# Patient Record
Sex: Female | Born: 1960 | Race: White | Hispanic: No | Marital: Married | Smoking: Current every day smoker
Health system: Southern US, Community
[De-identification: ages and names within clinical notes are randomized; demographics above are authoritative.]

## PROBLEM LIST (undated history)

## (undated) DIAGNOSIS — J189 Pneumonia, unspecified organism: Secondary | ICD-10-CM

## (undated) DIAGNOSIS — M1990 Unspecified osteoarthritis, unspecified site: Secondary | ICD-10-CM

## (undated) DIAGNOSIS — Z8709 Personal history of other diseases of the respiratory system: Secondary | ICD-10-CM

## (undated) DIAGNOSIS — K219 Gastro-esophageal reflux disease without esophagitis: Secondary | ICD-10-CM

## (undated) HISTORY — PX: TONSILLECTOMY: SUR1361

## (undated) HISTORY — PX: HYSTEROSCOPY DIAGNOSTIC: PRO49

## (undated) HISTORY — PX: EYE SURGERY: SHX253

## (undated) HISTORY — PX: NECK SURGERY: SHX720

## (undated) HISTORY — PX: BREAST SURGERY: SHX581

## (undated) HISTORY — PX: OTHER SURGICAL HISTORY: SHX169

---

## 1998-01-10 ENCOUNTER — Ambulatory Visit (HOSPITAL_COMMUNITY): Admission: RE | Admit: 1998-01-10 | Discharge: 1998-01-10 | Payer: Self-pay | Admitting: Oral and Maxillofacial Surgery

## 1998-08-10 ENCOUNTER — Encounter: Payer: Self-pay | Admitting: Urology

## 1998-08-10 ENCOUNTER — Ambulatory Visit (HOSPITAL_COMMUNITY): Admission: RE | Admit: 1998-08-10 | Discharge: 1998-08-10 | Payer: Self-pay | Admitting: Obstetrics and Gynecology

## 1999-01-01 ENCOUNTER — Emergency Department (HOSPITAL_COMMUNITY): Admission: EM | Admit: 1999-01-01 | Discharge: 1999-01-01 | Payer: Self-pay | Admitting: Emergency Medicine

## 1999-01-03 ENCOUNTER — Ambulatory Visit (HOSPITAL_COMMUNITY): Admission: RE | Admit: 1999-01-03 | Discharge: 1999-01-03 | Payer: Self-pay | Admitting: Internal Medicine

## 1999-07-30 ENCOUNTER — Other Ambulatory Visit: Admission: RE | Admit: 1999-07-30 | Discharge: 1999-07-30 | Payer: Self-pay | Admitting: Gynecology

## 1999-10-18 ENCOUNTER — Ambulatory Visit (HOSPITAL_COMMUNITY): Admission: RE | Admit: 1999-10-18 | Discharge: 1999-10-18 | Payer: Self-pay | Admitting: Gastroenterology

## 2000-07-30 ENCOUNTER — Other Ambulatory Visit: Admission: RE | Admit: 2000-07-30 | Discharge: 2000-07-30 | Payer: Self-pay | Admitting: Gynecology

## 2000-08-11 ENCOUNTER — Ambulatory Visit (HOSPITAL_COMMUNITY): Admission: RE | Admit: 2000-08-11 | Discharge: 2000-08-11 | Payer: Self-pay | Admitting: *Deleted

## 2000-08-11 ENCOUNTER — Encounter: Payer: Self-pay | Admitting: *Deleted

## 2001-02-09 ENCOUNTER — Encounter: Payer: Self-pay | Admitting: Family Medicine

## 2001-02-09 ENCOUNTER — Ambulatory Visit (HOSPITAL_COMMUNITY): Admission: RE | Admit: 2001-02-09 | Discharge: 2001-02-09 | Payer: Self-pay | Admitting: Family Medicine

## 2001-06-11 ENCOUNTER — Ambulatory Visit: Admission: RE | Admit: 2001-06-11 | Discharge: 2001-06-11 | Payer: Self-pay | Admitting: Family Medicine

## 2001-06-11 ENCOUNTER — Encounter: Payer: Self-pay | Admitting: Family Medicine

## 2002-02-04 ENCOUNTER — Other Ambulatory Visit: Admission: RE | Admit: 2002-02-04 | Discharge: 2002-02-04 | Payer: Self-pay | Admitting: Gynecology

## 2002-02-18 ENCOUNTER — Inpatient Hospital Stay (HOSPITAL_COMMUNITY): Admission: AD | Admit: 2002-02-18 | Discharge: 2002-02-18 | Payer: Self-pay | Admitting: Gynecology

## 2002-06-23 ENCOUNTER — Encounter: Admission: RE | Admit: 2002-06-23 | Discharge: 2002-06-23 | Payer: Self-pay | Admitting: Family Medicine

## 2002-07-01 ENCOUNTER — Encounter: Payer: Self-pay | Admitting: Family Medicine

## 2002-07-01 ENCOUNTER — Ambulatory Visit (HOSPITAL_COMMUNITY): Admission: RE | Admit: 2002-07-01 | Discharge: 2002-07-01 | Payer: Self-pay | Admitting: Family Medicine

## 2003-02-09 ENCOUNTER — Other Ambulatory Visit: Admission: RE | Admit: 2003-02-09 | Discharge: 2003-02-09 | Payer: Self-pay | Admitting: Gynecology

## 2004-03-08 ENCOUNTER — Other Ambulatory Visit: Admission: RE | Admit: 2004-03-08 | Discharge: 2004-03-08 | Payer: Self-pay | Admitting: Gynecology

## 2004-03-09 ENCOUNTER — Encounter: Admission: RE | Admit: 2004-03-09 | Discharge: 2004-03-09 | Payer: Self-pay | Admitting: Specialist

## 2004-04-12 ENCOUNTER — Encounter: Admission: RE | Admit: 2004-04-12 | Discharge: 2004-04-12 | Payer: Self-pay | Admitting: Gynecology

## 2005-03-08 ENCOUNTER — Other Ambulatory Visit: Admission: RE | Admit: 2005-03-08 | Discharge: 2005-03-08 | Payer: Self-pay | Admitting: Gynecology

## 2005-03-15 ENCOUNTER — Encounter: Admission: RE | Admit: 2005-03-15 | Discharge: 2005-03-15 | Payer: Self-pay | Admitting: Gynecology

## 2005-06-05 ENCOUNTER — Inpatient Hospital Stay (HOSPITAL_COMMUNITY): Admission: RE | Admit: 2005-06-05 | Discharge: 2005-06-06 | Payer: Self-pay | Admitting: Neurosurgery

## 2006-03-12 ENCOUNTER — Other Ambulatory Visit: Admission: RE | Admit: 2006-03-12 | Discharge: 2006-03-12 | Payer: Self-pay | Admitting: Gynecology

## 2006-03-21 ENCOUNTER — Encounter: Admission: RE | Admit: 2006-03-21 | Discharge: 2006-03-21 | Payer: Self-pay | Admitting: Gynecology

## 2006-04-01 ENCOUNTER — Encounter: Admission: RE | Admit: 2006-04-01 | Discharge: 2006-04-01 | Payer: Self-pay | Admitting: Gynecology

## 2006-04-01 ENCOUNTER — Encounter (INDEPENDENT_AMBULATORY_CARE_PROVIDER_SITE_OTHER): Payer: Self-pay | Admitting: Specialist

## 2007-03-24 ENCOUNTER — Other Ambulatory Visit: Admission: RE | Admit: 2007-03-24 | Discharge: 2007-03-24 | Payer: Self-pay | Admitting: Gynecology

## 2007-03-24 ENCOUNTER — Encounter: Admission: RE | Admit: 2007-03-24 | Discharge: 2007-03-24 | Payer: Self-pay | Admitting: Gynecology

## 2008-03-23 ENCOUNTER — Encounter: Admission: RE | Admit: 2008-03-23 | Discharge: 2008-03-23 | Payer: Self-pay | Admitting: Gynecology

## 2008-03-23 ENCOUNTER — Other Ambulatory Visit: Admission: RE | Admit: 2008-03-23 | Discharge: 2008-03-23 | Payer: Self-pay | Admitting: Gynecology

## 2008-07-01 ENCOUNTER — Encounter: Admission: RE | Admit: 2008-07-01 | Discharge: 2008-07-01 | Payer: Self-pay | Admitting: Family Medicine

## 2008-10-12 ENCOUNTER — Ambulatory Visit (HOSPITAL_COMMUNITY): Admission: RE | Admit: 2008-10-12 | Discharge: 2008-10-12 | Payer: Self-pay | Admitting: Neurosurgery

## 2009-11-21 IMAGING — RF DG MYELOGRAM LUMBAR
13 series · 13 of 13 positions shown · IV contrast (omnipaque)
Comparison: MRI 07/01/2008
COMPARISON: MRI 07/01/2008.

CLINICAL DATA: Low back pain with right leg pain.

MYELOGRAM LUMBAR
TECHNIQUE: Lumbar puncture was performed at L2-3 by Dr. Barth.
Following injection of intrathecal Omnipaque contrast, spine
imaging in multiple projections was performed using fluoroscopy.
Fluoroscopy Time: 1.2 minutes.
CT MYELOGRAPHY LUMBAR SPINE
TECHNIQUE: CT imaging of the lumbar spine was performed after
intrathecal contrast administration.  Multiplanar CT image
reconstructions were also generated.

[Series 1: run · 1 of 1 slices shown (1 of 13)]
[im 1/1]
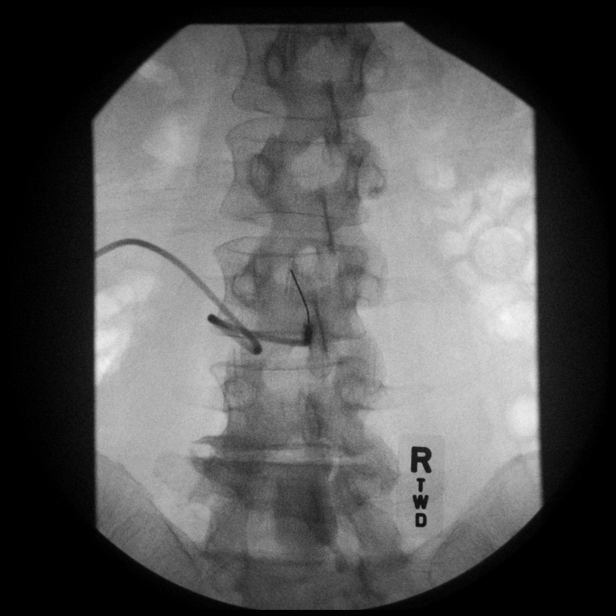

[Series 2: run · 1 of 1 slices shown (2 of 13)]
[im 1/1]
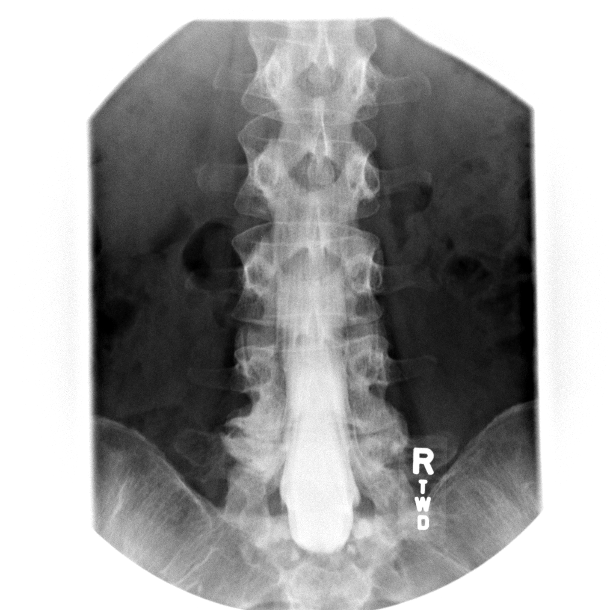

[Series 3: run · 1 of 1 slices shown (3 of 13)]
[im 1/1]
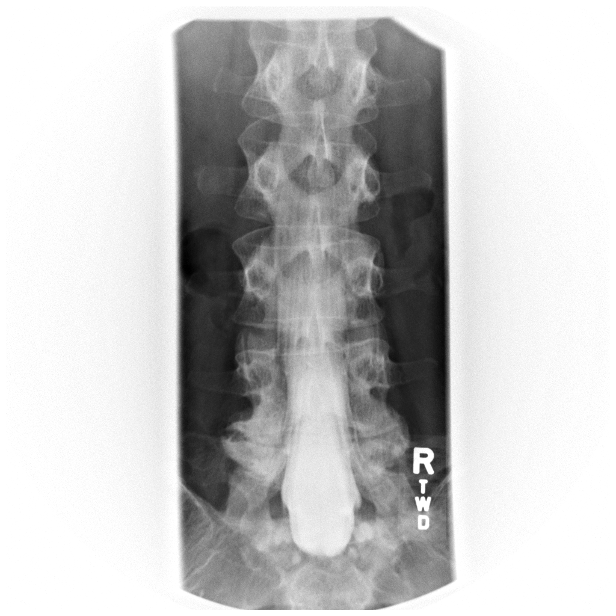

[Series 4: run · 1 of 1 slices shown (4 of 13)]
[im 1/1]
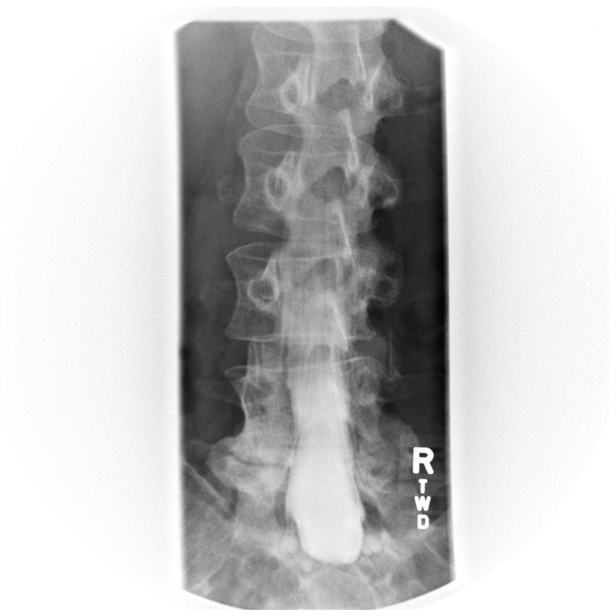

[Series 5: run · 1 of 1 slices shown (5 of 13)]
[im 1/1]
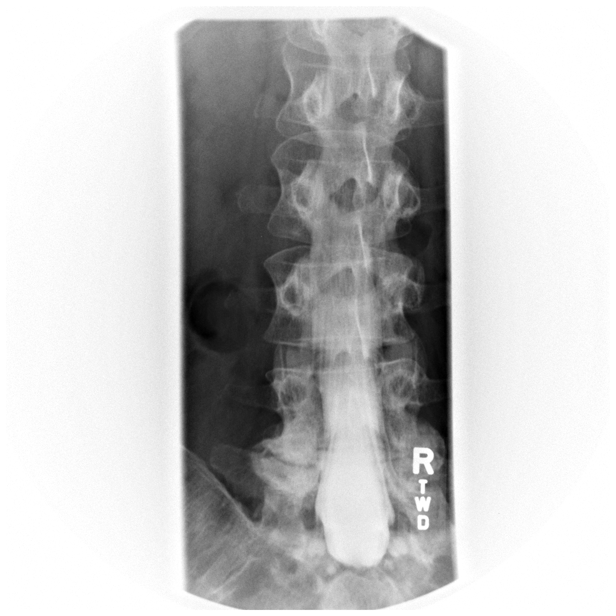

[Series 6: run · 1 of 1 slices shown (6 of 13)]
[im 1/1]
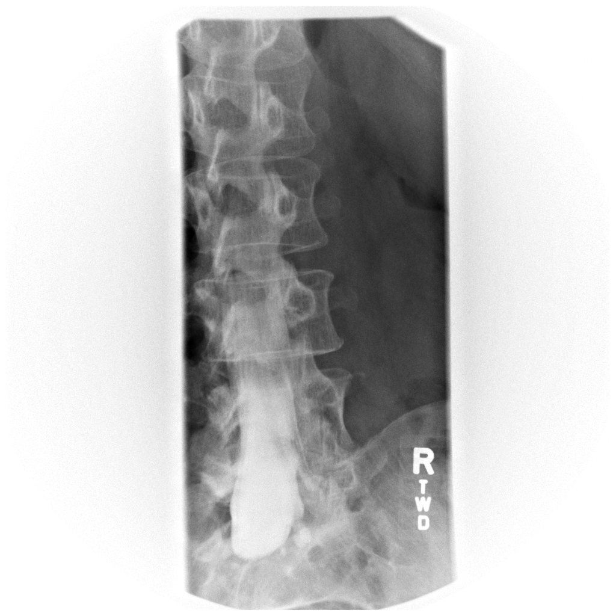

[Series 7: run · 1 of 1 slices shown (7 of 13)]
[im 1/1]
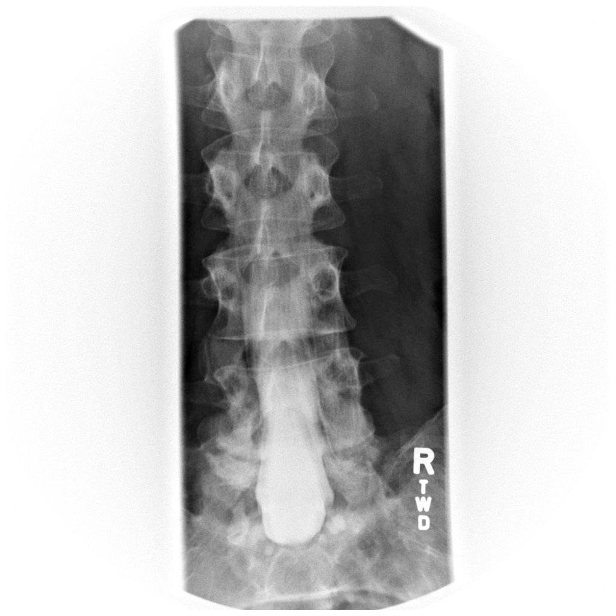

[Series 8: run · 1 of 1 slices shown (8 of 13)]
[im 1/1]
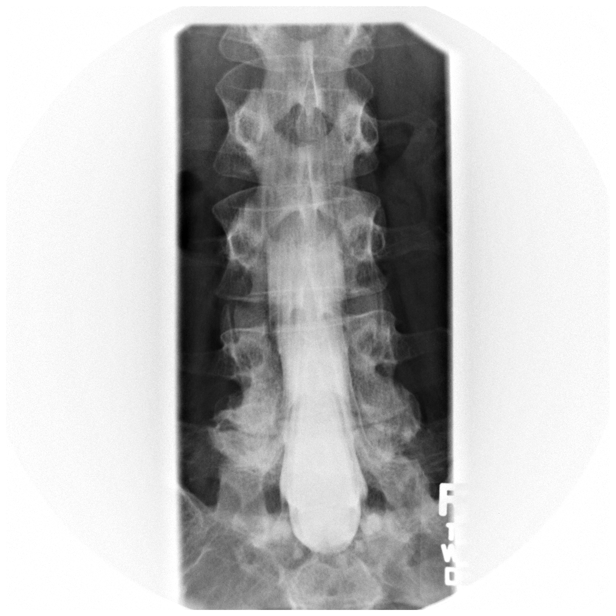

[Series 9: run · 1 of 1 slices shown (9 of 13)]
[im 1/1]
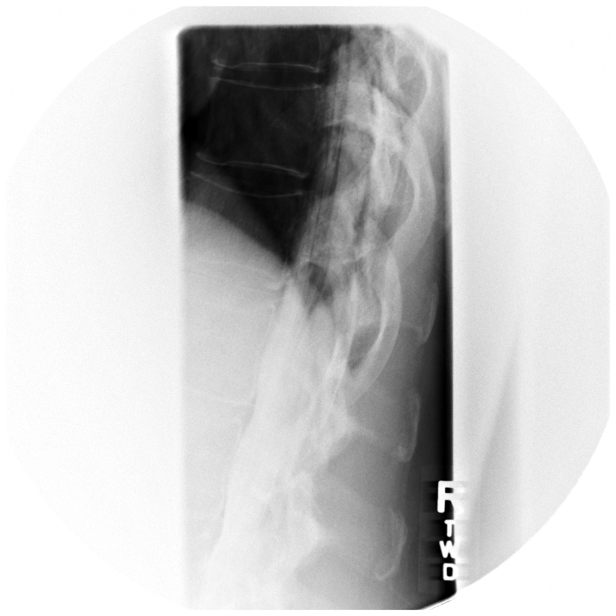

[Series 10: run · 1 of 1 slices shown (10 of 13)]
[im 1/1]
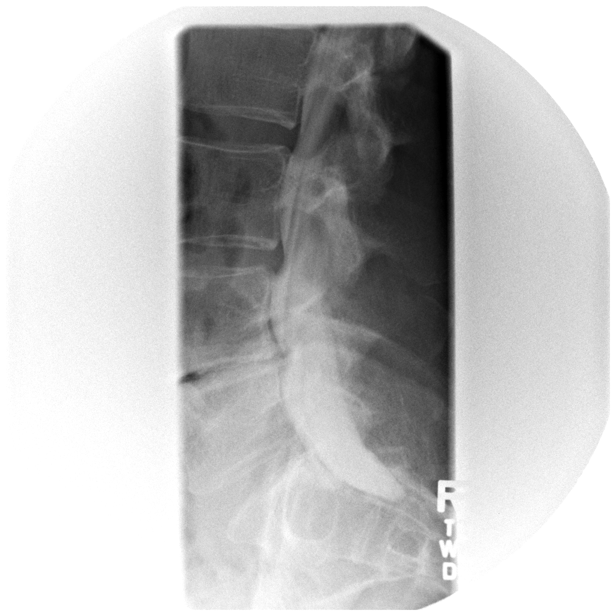

[Series 11: run · 1 of 1 slices shown (11 of 13)]
[im 1/1]
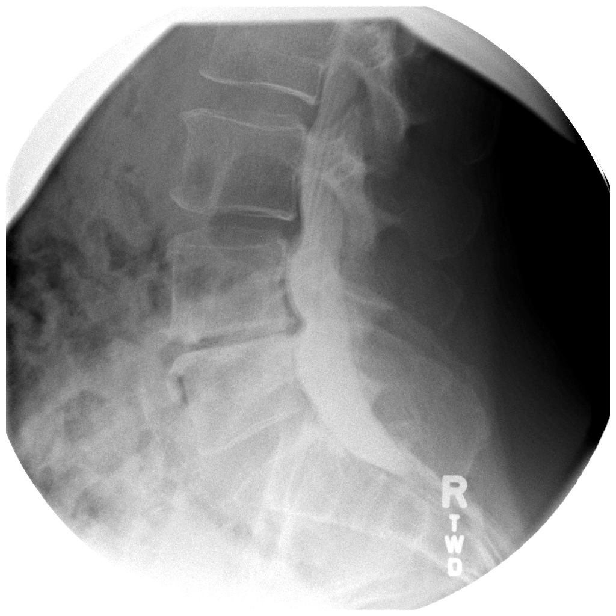

[Series 12: run · 1 of 1 slices shown (12 of 13)]
[im 1/1]
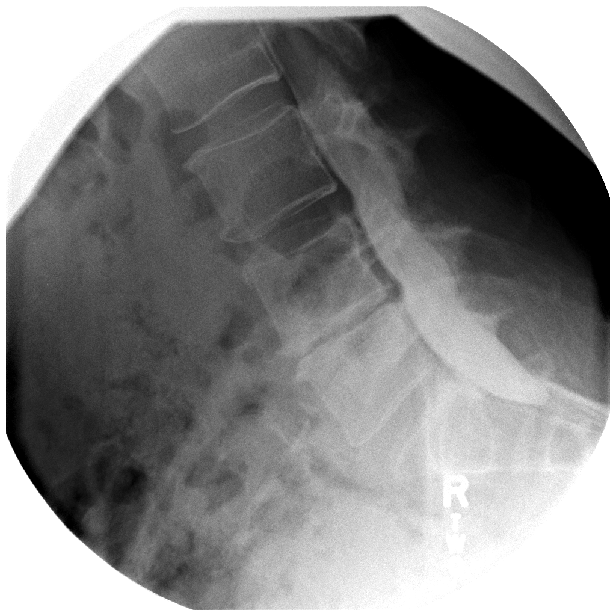

[Series 13: run · 1 of 1 slices shown (13 of 13)]
[im 1/1]
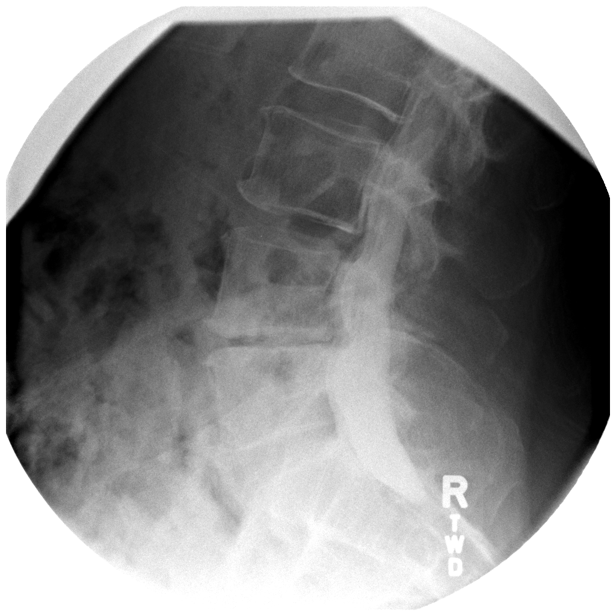

[13 of 13 positions shown; findings below may reference images not displayed]

FINDINGS: Normal lumbar alignment.  No fracture is identified.
There is advanced disc degeneration at L4-5 with disc space
narrowing and endplate spurring causing a ventral epidural defect.
There is a mild ventral extradural defect at L3-4.  No significant
spinal stenosis.

Standing flexion/extension views reveal stable alignment without
abnormal motion.  Ventral extra defect at L3-4 is  more prominent
on extension than  flexion.  Conus medullaris is normal.
IMPRESSION: Advanced disc degeneration and spurring at L4-5.  Mild ventral
extradural defect at L3-4.

No significant spinal stenosis.
FINDINGS: Normal lumbar alignment.  No acute bony abnormality.
The conus medullaris is normal.

L1-2:  Negative

L2-3:  Negative

L3-4:  Shallow central to left-sided small disc protrusion is
unchanged from the MRI.  Questionable encroachment of the left L4
nerve root in the lateral recess.  The left L3 nerve root is
slightly swollen but not compressed in the foramen.  There is
contrast in the left L3 nerve root sleeve and the nerve root
appears slightly swollen.  There is early facet degeneration
without significant spinal stenosis.

L4-5:  Advanced disc degeneration with disc space narrowing and
vertebral spurring, primarily on the left side.  This is causing
mild left foraminal encroachment.  There is moderate facet
overgrowth bilaterally.  Central canal is patent.

L5-S1:  Mild disc degeneration without stenosis or disc protrusion.
IMPRESSION: Shallow central and left-sided disc protrusion at L3-4 is unchanged
from the prior MRI.  There is questionable impingement of the left
L4 nerve root in the lateral recess.  The left L3 nerve root
appears slightly swollen but is not compressed.

Advanced disc degeneration at L4-5 with left-sided spurring and
mild left foraminal encroachment.

No change from the prior MRI.

## 2010-01-30 ENCOUNTER — Encounter: Admission: RE | Admit: 2010-01-30 | Discharge: 2010-01-30 | Payer: Self-pay | Admitting: Gynecology

## 2010-09-21 NOTE — Procedures (Signed)
[facility]  Patient:    Rose Blair, Rose Blair                       MRN: [id number] Adm. Date:  [id number] Disc. Date: [id number] Attending:  Deneen Harts CC:         Leatha Gilding. Mezer, M.D.                           Procedure Report  PROCEDURE:  Colonoscopy.  INDICATION:  A 50 year old white female with intermittent hematochezia.  Left lower quadrant pain.  Known history of endometriosis.  Undergoing colonoscopy for neoplasia surveillance and for diagnostic evaluation.  DESCRIPTION OF PROCEDURE:  After reviewing the nature of the procedure with the patient, including potential risks and complications, and after discussing alternative methods of diagnosis and treatment, informed consent was signed.  The patient was premedicated, receiving IV sedating totalling Versed 10 mg, fentanyl 75 mcg administered in divided doses prior to and during the course of the procedure.  Using an Olympus pediatric PCF-140L video colonoscope, rectum was intubated after normal digital examination.  The scope was advanced around the entire length of the colon to the cecum, identified by the appendiceal orifice and ileocecal valve.  Visicol preparation was excellent throughout.  The scope was slowly withdrawn with careful inspection of the entire colon in a retrograde manner.  No abnormality was noted.  Specifically without evidence of neoplasia, mucosal inflammation, vascular lesion, or diverticular change.  No lesion in the rectosigmoid to explain left lower quadrant pain.  Retroflexed view in the rectal vault was normal.  Colon was decompressed, scope withdrawn.  The patient tolerated the procedure without difficulty, being maintained on Datascope monitor, low-flow oxygen throughout.  Returned to recovery in stable condition.  Time 2, technical 1, preparation 1 (Visicol), total score equals 4.  ASSESSMENT: 1. Normal colonoscopy. 2. Hematochezia, probably secondary to  intermittent anorectal fissure.  RECOMMENDATION: 1. Repeat colonoscopy 10 years for neoplasia surveillance. 2. Annual Hemoccult per Dr. Chevis Pretty. 3. Miralax one cap full nightly. 4. Levbid one tablet p.o. q.h.s. DD:  10/18/99 TD:  10/23/99 Job: [id number]

## 2010-09-21 NOTE — Op Note (Signed)
NAMEJAVEAH, Rose Blair                ACCOUNT NO.:  [id number]   MEDICAL RECORD NO.:  [id number]          PATIENT TYPE:  INP   LOCATION:  2899                         FACILITY:  [facility]   PHYSICIAN:  Hewitt Shorts, M.D.DATE OF BIRTH:  10-20-1960   DATE OF PROCEDURE:  06/05/2005  DATE OF DISCHARGE:                                 OPERATIVE REPORT   PREOPERATIVE DIAGNOSES:  C5-6 and C6-7 cervical spondylosis and degenerative  disk disease, C6-7 cervical disk herniation and right cervical  radiculopathy.   POSTOPERATIVE DIAGNOSES:  C5-6 and C6-7 cervical spondylosis and  degenerative disk disease, C6-7 cervical disk herniation and right cervical  radiculopathy.   PROCEDURE:  C5-6 and C6-7 anterior cervical diskectomy and arthrodesis with  VG-2 allograft and Tether cervical plating.   SURGEON:  Hewitt Shorts, M.D.   ASSISTANT:  Stefani Dama, M.D.   ANESTHESIA:  General endotracheal.   INDICATIONS:  This is a 50 year old woman who presented with a right  cervical radiculopathy.  She had relatively advanced spondylosis and  degenerative disk disease involving the C5-6 level with more mild  degenerative changes seen at C6-7; however, there was a central and to the  right C6-7 cervical disk herniation.  A decision made to proceed with a two-  level anterior cervical diskectomy and arthrodesis.   PROCEDURE:  The patient was brought to the operating room and placed under  general endotracheal anesthesia.  The patient placed in 10 pounds of halter  traction and the neck was prepped with Betadine soap and solution and draped  in a sterile fashion.  A horizontal incision was made in the left side of  the neck.  The line of the incision was infiltrated with local anesthetic  with epinephrine.  Dissection was carried down through the subcutaneous  tissue and platysma with bipolar cautery and electrocautery used to maintain  hemostasis.  Dissection was carried down through an  avascular plane leaving  the sternocleidomastoid, carotid artery and jugular vein laterally and the  trachea and esophagus medially.  The ventral aspect of the vertebral column  identified, a localizing x-ray was taken, and the C5-6 and C6-7  intervertebral disk spaces were identified.  Diskectomy was begun with the  incision in the annulus, continued with microcurettes and pituitary  rongeurs.  Anterior aspect of the overgrowth was carefully removed.  The  microscope was draped and brought into the field to provide additional  magnification, illumination and visualization, and the remainder of the  decompression was performed using microdissection and microsurgical  technique.  The cartilaginous end plates of the corresponding vertebrae were  removed using microcurettes along with the X-Max drill and then the  posterior osteophytic overgrowth was removed using the X-Max drill along  with the 2 mm Kerrison punch with a thin foot plate.  The posterior  longitudinal ligament was carefully opened at each level.  The disk  herniation at C6-7 was then removed and the posterior longitudinal ligament  was removed, decompressing the spinal canal and thecal sac.  We then turned  our attention to the neural foramina bilaterally at each  level, which were  decompressed using the X-Max drill and Kerrison punches.  Once each of the  neural foramina and exiting nerve roots was decompressed, we established  hemostasis with the use of Gelfoam soaked in thrombin and then proceeded  with an arthrodesis.  We measured the height of the intervertebral disk  space at each level and selected a 7 mm graft for each level.  These were  hydrated withy saline solution and then carefully positioned in the  intervertebral disk space and counter sunk.  We then selected a 35 mm Tether  cervical plate.  It was positioned over the fusion construct and secured  with 4 x 14 mm screws.  We placed two screws at C5 and C7 and a  single screw  at C6.  Each of the screw heads was drilled and tapped and the screws placed  in an alternating fashion.  Final tightening was done once all five screws  were in place.  The wound was then irrigated with bacitracin solution and  checked for hemostasis, which was established and confirmed.  An x-ray was  taken, which showed the screws at C5 and C6 to be in good position as well  as the graft at C5-6.  The remainder of the construct could not be  visualized because of the patient's shoulders.  Under direct visualization,  the construct appeared good.  We then checked once again for hemostasis,  which was established and confirmed, and then proceeded with closure.  The  platysma was closed with interrupted, inverted 2-0 undyed Vicryl sutures,  the subcutaneous and subcuticular layer were closed with interrupted,  inverted 3-0 undyed Vicryl sutures, and the skin was approximated with  Dermabond.  The procedure was tolerated well.  The estimated blood loss was  50 mL, sponge and needle count correct.  Following the surgery, the patient  was to be reversed from the anesthetic, extubated and transferred to the  recovery room for further care.      Hewitt Shorts, M.D.  Electronically Signed     RWN/MEDQ  D:  06/05/2005  T:  06/05/2005  Job:  [id number]

## 2011-05-23 ENCOUNTER — Other Ambulatory Visit: Payer: Self-pay | Admitting: Endodontics

## 2014-08-15 ENCOUNTER — Other Ambulatory Visit: Payer: Self-pay | Admitting: Gynecology

## 2014-08-16 LAB — CYTOLOGY - PAP

## 2015-11-18 ENCOUNTER — Other Ambulatory Visit: Payer: Self-pay | Admitting: Physician Assistant

## 2015-11-18 DIAGNOSIS — M609 Myositis, unspecified: Secondary | ICD-10-CM

## 2015-12-20 ENCOUNTER — Ambulatory Visit
Admission: RE | Admit: 2015-12-20 | Discharge: 2015-12-20 | Disposition: A | Discharge status: Home / Self Care | Payer: Commercial Managed Care - PPO | Source: Ambulatory Visit | Attending: Physician Assistant | Admitting: Physician Assistant

## 2015-12-20 DIAGNOSIS — M609 Myositis, unspecified: Secondary | ICD-10-CM

## 2016-01-12 ENCOUNTER — Other Ambulatory Visit: Payer: Self-pay | Admitting: General Surgery

## 2016-02-01 ENCOUNTER — Encounter (HOSPITAL_COMMUNITY): Payer: Self-pay | Admitting: *Deleted

## 2016-02-01 ENCOUNTER — Encounter (HOSPITAL_COMMUNITY)
Admission: RE | Admit: 2016-02-01 | Discharge: 2016-02-01 | Disposition: A | Discharge status: Home / Self Care | Payer: Commercial Managed Care - PPO | Source: Ambulatory Visit | Attending: General Surgery | Admitting: General Surgery

## 2016-02-01 DIAGNOSIS — Z01812 Encounter for preprocedural laboratory examination: Secondary | ICD-10-CM | POA: Insufficient documentation

## 2016-02-01 DIAGNOSIS — M6281 Muscle weakness (generalized): Secondary | ICD-10-CM | POA: Insufficient documentation

## 2016-02-01 HISTORY — DX: Unspecified osteoarthritis, unspecified site: M19.90

## 2016-02-01 HISTORY — DX: Pneumonia, unspecified organism: J18.9

## 2016-02-01 HISTORY — DX: Personal history of other diseases of the respiratory system: Z87.09

## 2016-02-01 HISTORY — DX: Gastro-esophageal reflux disease without esophagitis: K21.9

## 2016-02-01 LAB — BASIC METABOLIC PANEL
ANION GAP: 6 (ref 5–15)
BUN: 14 mg/dL (ref 6–20)
CO2: 28 mmol/L (ref 22–32)
Calcium: 8.9 mg/dL (ref 8.9–10.3)
Chloride: 107 mmol/L (ref 101–111)
Creatinine, Ser: 0.92 mg/dL (ref 0.44–1.00)
GLUCOSE: 141 mg/dL — AB (ref 65–99)
POTASSIUM: 3.7 mmol/L (ref 3.5–5.1)
Sodium: 141 mmol/L (ref 135–145)

## 2016-02-01 LAB — CBC
HEMATOCRIT: 41.1 % (ref 36.0–46.0)
HEMOGLOBIN: 13.8 g/dL (ref 12.0–15.0)
MCH: 33.3 pg (ref 26.0–34.0)
MCHC: 33.6 g/dL (ref 30.0–36.0)
MCV: 99.3 fL (ref 78.0–100.0)
Platelets: 270 10*3/uL (ref 150–400)
RBC: 4.14 MIL/uL (ref 3.87–5.11)
RDW: 12.5 % (ref 11.5–15.5)
WBC: 7.4 10*3/uL (ref 4.0–10.5)

## 2016-02-01 NOTE — Patient Instructions (Signed)
Rose Blair  02/01/2016   Your procedure is scheduled on: Thursday February 08, 2016  [facility]  Entrance take [name]  elevators to 3rd floor to  Short Stay Center at 5:30 AM.  Call this number if you have problems the morning of surgery [phone]   Remember: ONLY 1 PERSON MAY GO WITH YOU TO SHORT STAY TO GET  READY MORNING OF YOUR SURGERY.  Do not eat food or drink liquids :After Midnight.     Take these medicines the morning of surgery: May use eye drops if needed May use flonase nasal spray if needed                                 You may not have any metal on your body including hair pins and              piercings  Do not wear jewelry, make-up, lotions, powders or perfumes, deodorant             Do not wear nail polish.  Do not shave  48 hours prior to surgery.        Do not bring valuables to the hospital. [facility] IS NOT             RESPONSIBLE   FOR VALUABLES.  Contacts, dentures or bridgework may not be worn into surgery. .     Patients discharged the day of surgery will not be allowed to drive home.  Name and phone number of your driver:Rose Blair (husband)  _____________________________________________________________________             [facility] Pre[facility] Before surgery, you can play an important role.  Because skin is not sterile, your skin needs to be as free of germs as possible.  You can reduce the number of germs on your skin by washing with CHG (chlorahexidine gluconate) soap before surgery.  CHG is an antiseptic cleaner which kills germs and bonds with the skin to continue killing germs even after washing. Please DO NOT use if you have an allergy to CHG or antibacterial soaps.  If your skin becomes reddened/irritated stop using the CHG and inform your nurse when you arrive at Short Stay. Do not shave (including legs and underarms) for at least 48 hours prior to the first CHG shower.  You may shave  your face/neck. Please follow these instructions carefully:  1.  Shower with CHG Soap the night before surgery and the  morning of Surgery.  2.  If you choose to wash your hair, wash your hair first as usual with your  normal  shampoo.  3.  After you shampoo, rinse your hair and body thoroughly to remove the  shampoo.                           4.  Use CHG as you would any other liquid soap.  You can apply chg directly  to the skin and wash                       Gently with a scrungie or clean washcloth.  5.  Apply the CHG Soap to your body ONLY FROM THE NECK DOWN.   Do not use on face/ open  Wound or open sores. Avoid contact with eyes, ears mouth and genitals (private parts).                       Wash face,  Genitals (private parts) with your normal soap.             6.  Wash thoroughly, paying special attention to the area where your surgery  will be performed.  7.  Thoroughly rinse your body with warm water from the neck down.  8.  DO NOT shower/wash with your normal soap after using and rinsing off  the CHG Soap.                9.  Pat yourself dry with a clean towel.            10.  Wear clean pajamas.            11.  Place clean sheets on your bed the night of your first shower and do not  sleep with pets. Day of Surgery : Do not apply any lotions/deodorants the morning of surgery.  Please wear clean clothes to the hospital/surgery center.  FAILURE TO FOLLOW THESE INSTRUCTIONS MAY RESULT IN THE CANCELLATION OF YOUR SURGERY PATIENT SIGNATURE_________________________________  NURSE SIGNATURE__________________________________  ________________________________________________________________________

## 2016-02-07 NOTE — Anesthesia Preprocedure Evaluation (Addendum)
Anesthesia Evaluation  Patient identified by MRN, date of birth, ID band Patient awake    Reviewed: Allergy & Precautions, NPO status , Patient's Chart, lab work & pertinent test results  History of Anesthesia Complications Negative for: history of anesthetic complications  Airway Mallampati: II   Neck ROM: full    Dental   Pulmonary Current Smoker,    breath sounds clear to auscultation       Cardiovascular negative cardio ROS   Rhythm:regular Rate:Normal     Neuro/Psych negative neurological ROS  negative psych ROS   GI/Hepatic Neg liver ROS, GERD  ,  Endo/Other  negative endocrine ROS  Renal/GU negative Renal ROS     Musculoskeletal  (+) Arthritis ,   Abdominal   Peds  Hematology   Anesthesia Other Findings   Reproductive/Obstetrics                            Anesthesia Physical Anesthesia Plan  ASA: II  Anesthesia Plan: General   Post-op Pain Management:    Induction: Intravenous  Airway Management Planned: LMA  Additional Equipment:   Intra-op Plan:   Post-operative Plan: Extubation in OR  Informed Consent: I have reviewed the patients History and Physical, chart, labs and discussed the procedure including the risks, benefits and alternatives for the proposed anesthesia with the patient or authorized representative who has indicated his/her understanding and acceptance.     Plan Discussed with: CRNA, Anesthesiologist and Surgeon  Anesthesia Plan Comments:        Anesthesia Quick Evaluation

## 2016-02-08 ENCOUNTER — Encounter (HOSPITAL_COMMUNITY): Payer: Self-pay | Admitting: *Deleted

## 2016-02-08 ENCOUNTER — Ambulatory Visit (HOSPITAL_COMMUNITY): Payer: Commercial Managed Care - PPO | Admitting: Anesthesiology

## 2016-02-08 ENCOUNTER — Encounter (HOSPITAL_COMMUNITY)
Admission: RE | Disposition: A | Discharge status: Home / Self Care | Payer: Self-pay | Source: Ambulatory Visit | Attending: General Surgery

## 2016-02-08 ENCOUNTER — Ambulatory Visit (HOSPITAL_COMMUNITY)
Admission: RE | Admit: 2016-02-08 | Discharge: 2016-02-08 | Disposition: A | Discharge status: Home / Self Care | Payer: Commercial Managed Care - PPO | Source: Ambulatory Visit | Attending: General Surgery | Admitting: General Surgery

## 2016-02-08 DIAGNOSIS — M62551 Muscle wasting and atrophy, not elsewhere classified, right thigh: Secondary | ICD-10-CM | POA: Insufficient documentation

## 2016-02-08 DIAGNOSIS — M6281 Muscle weakness (generalized): Secondary | ICD-10-CM | POA: Insufficient documentation

## 2016-02-08 DIAGNOSIS — F17200 Nicotine dependence, unspecified, uncomplicated: Secondary | ICD-10-CM | POA: Insufficient documentation

## 2016-02-08 DIAGNOSIS — M1990 Unspecified osteoarthritis, unspecified site: Secondary | ICD-10-CM | POA: Diagnosis not present

## 2016-02-08 DIAGNOSIS — Z791 Long term (current) use of non-steroidal anti-inflammatories (NSAID): Secondary | ICD-10-CM | POA: Insufficient documentation

## 2016-02-08 HISTORY — PX: MUSCLE BIOPSY: SHX716

## 2016-02-08 SURGERY — MUSCLE BIOPSY
Anesthesia: General | Site: Thigh | Laterality: Right

## 2016-02-08 MED ORDER — PROPOFOL 10 MG/ML IV BOLUS
INTRAVENOUS | Status: DC | PRN
  Administered 2016-02-08: 200 mg via INTRAVENOUS

## 2016-02-08 MED ORDER — FENTANYL CITRATE (PF) 100 MCG/2ML IJ SOLN
INTRAMUSCULAR | Status: DC | PRN
  Administered 2016-02-08: 50 ug via INTRAVENOUS

## 2016-02-08 MED ORDER — LIDOCAINE 2% (20 MG/ML) 5 ML SYRINGE
INTRAMUSCULAR | Status: AC
  Filled 2016-02-08: qty 5

## 2016-02-08 MED ORDER — FENTANYL CITRATE (PF) 100 MCG/2ML IJ SOLN
INTRAMUSCULAR | Status: AC
  Filled 2016-02-08: qty 2

## 2016-02-08 MED ORDER — MIDAZOLAM HCL 2 MG/2ML IJ SOLN
INTRAMUSCULAR | Status: AC
  Filled 2016-02-08: qty 2

## 2016-02-08 MED ORDER — CEFAZOLIN SODIUM-DEXTROSE 2-4 GM/100ML-% IV SOLN
INTRAVENOUS | Status: AC
  Filled 2016-02-08: qty 100

## 2016-02-08 MED ORDER — MIDAZOLAM HCL 5 MG/5ML IJ SOLN
INTRAMUSCULAR | Status: DC | PRN
  Administered 2016-02-08: 2 mg via INTRAVENOUS

## 2016-02-08 MED ORDER — CEFAZOLIN SODIUM-DEXTROSE 2-4 GM/100ML-% IV SOLN
2.0000 g | INTRAVENOUS | Status: AC
  Administered 2016-02-08: 2 g via INTRAVENOUS

## 2016-02-08 MED ORDER — LIDOCAINE HCL (CARDIAC) 20 MG/ML IV SOLN
INTRAVENOUS | Status: DC | PRN
  Administered 2016-02-08: 100 mg via INTRAVENOUS

## 2016-02-08 MED ORDER — ONDANSETRON HCL 4 MG/2ML IJ SOLN
INTRAMUSCULAR | Status: AC
  Filled 2016-02-08: qty 2

## 2016-02-08 MED ORDER — 0.9 % SODIUM CHLORIDE (POUR BTL) OPTIME
TOPICAL | Status: DC | PRN
  Administered 2016-02-08: 1000 mL

## 2016-02-08 MED ORDER — LACTATED RINGERS IV SOLN
INTRAVENOUS | Status: DC | PRN
  Administered 2016-02-08: 1000 mL
  Administered 2016-02-08: 07:00:00 via INTRAVENOUS

## 2016-02-08 MED ORDER — BUPIVACAINE-EPINEPHRINE (PF) 0.5% -1:200000 IJ SOLN
INTRAMUSCULAR | Status: AC
  Filled 2016-02-08: qty 1.8

## 2016-02-08 MED ORDER — ONDANSETRON HCL 4 MG/2ML IJ SOLN
INTRAMUSCULAR | Status: DC | PRN
  Administered 2016-02-08: 4 mg via INTRAVENOUS

## 2016-02-08 MED ORDER — BUPIVACAINE-EPINEPHRINE 0.25% -1:200000 IJ SOLN
INTRAMUSCULAR | Status: DC | PRN
  Administered 2016-02-08: 15 mL

## 2016-02-08 MED ORDER — BUPIVACAINE HCL (PF) 0.5 % IJ SOLN
INTRAMUSCULAR | Status: AC
  Filled 2016-02-08: qty 30

## 2016-02-08 MED ORDER — PROPOFOL 10 MG/ML IV BOLUS
INTRAVENOUS | Status: AC
  Filled 2016-02-08: qty 40

## 2016-02-08 MED ORDER — DEXAMETHASONE SODIUM PHOSPHATE 10 MG/ML IJ SOLN
INTRAMUSCULAR | Status: DC | PRN
  Administered 2016-02-08: 10 mg via INTRAVENOUS

## 2016-02-08 MED ORDER — PROMETHAZINE HCL 25 MG/ML IJ SOLN: 6.2500 mg | INTRAMUSCULAR | Status: DC | PRN

## 2016-02-08 MED ORDER — DEXAMETHASONE SODIUM PHOSPHATE 10 MG/ML IJ SOLN
INTRAMUSCULAR | Status: AC
  Filled 2016-02-08: qty 1

## 2016-02-08 MED ORDER — HYDROMORPHONE HCL 1 MG/ML IJ SOLN: 0.2500 mg | INTRAMUSCULAR | Status: DC | PRN

## 2016-02-08 SURGICAL SUPPLY — 26 items
ADH SKN CLS APL DERMABOND .7 (GAUZE/BANDAGES/DRESSINGS) ×1
BLADE SURG SZ10 CARB STEEL (BLADE) ×2 IMPLANT
COVER SURGICAL LIGHT HANDLE (MISCELLANEOUS) ×2 IMPLANT
DECANTER SPIKE VIAL GLASS SM (MISCELLANEOUS) IMPLANT
DERMABOND ADVANCED (GAUZE/BANDAGES/DRESSINGS) ×1
DERMABOND ADVANCED .7 DNX12 (GAUZE/BANDAGES/DRESSINGS) IMPLANT
DRAPE LAPAROTOMY TRNSV 102X78 (DRAPE) IMPLANT
ELECT PENCIL ROCKER SW 15FT (MISCELLANEOUS) ×2 IMPLANT
ELECT REM PT RETURN 9FT ADLT (ELECTROSURGICAL) ×2
ELECTRODE REM PT RTRN 9FT ADLT (ELECTROSURGICAL) ×1 IMPLANT
GAUZE SPONGE 4X4 12PLY STRL (GAUZE/BANDAGES/DRESSINGS) ×2 IMPLANT
GLOVE BIOGEL PI IND STRL 7.0 (GLOVE) ×1 IMPLANT
GLOVE BIOGEL PI INDICATOR 7.0 (GLOVE) ×1
GOWN STRL REUS W/TWL 2XL LVL3 (GOWN DISPOSABLE) ×2 IMPLANT
GOWN STRL REUS W/TWL XL LVL3 (GOWN DISPOSABLE) ×2 IMPLANT
KIT BASIN OR (CUSTOM PROCEDURE TRAY) ×2 IMPLANT
NEEDLE HYPO 22GX1.5 SAFETY (NEEDLE) ×2 IMPLANT
PACK BASIC VI WITH GOWN DISP (CUSTOM PROCEDURE TRAY) ×2 IMPLANT
SPONGE LAP 18X18 X RAY DECT (DISPOSABLE) ×2 IMPLANT
STAPLER VISISTAT 35W (STAPLE) IMPLANT
SUT VIC AB 3-0 SH 18 (SUTURE) ×1 IMPLANT
SUT VIC AB 4-0 PS2 18 (SUTURE) IMPLANT
SYR 20CC LL (SYRINGE) ×2 IMPLANT
TOWEL OR 17X26 10 PK STRL BLUE (TOWEL DISPOSABLE) ×2 IMPLANT
TOWEL OR NON WOVEN STRL DISP B (DISPOSABLE) ×2 IMPLANT
YANKAUER SUCT BULB TIP 10FT TU (MISCELLANEOUS) ×2 IMPLANT

## 2016-02-08 NOTE — Anesthesia Postprocedure Evaluation (Signed)
Anesthesia Post Note  Patient: Rose GriffinsSandra L Whiteaker  Procedure(s) Performed: Procedure(s) (LRB): RIGHT THIGH ADDUCTOR MAGNUS MUSCLE BIOPSY (Right)  Patient location during evaluation: PACU Anesthesia Type: General Level of consciousness: awake and alert and patient cooperative Pain management: pain level controlled Vital Signs Assessment: post-procedure vital signs reviewed and stable Respiratory status: spontaneous breathing and respiratory function stable Cardiovascular status: stable Anesthetic complications: no    Last Vitals:  Vitals:   02/08/16 0830 02/08/16 0845  BP: 120/69 130/66  Pulse: 60 (!) 52  Resp: 15 13  Temp: 36.7 C     Last Pain:  Vitals:   02/08/16 0830  TempSrc:   PainSc: 0-No pain                 Rashid Whitenight S

## 2016-02-08 NOTE — Op Note (Signed)
02/08/2016  8:37 AM  PATIENT:  Rose GriffinsSandra L Puzzo  55 y.o. female  Patient Care Team: Iona HansenPenny L Jones, NP as PCP - General (Nurse Practitioner)  PRE-OPERATIVE DIAGNOSIS:  Progressive muscle weakness  POST-OPERATIVE DIAGNOSIS:  Progressive muscle weakness  PROCEDURE:   RIGHT THIGH ADDUCTOR MAGNUS MUSCLE BIOPSY    Surgeon(s): Romie LeveeAlicia Kasara Schomer, MD  ASSISTANT:none  ANESTHESIA:   local and MAC  EBL:  Total I/O In: 800 [I.V.:800] Out: 0   DRAINS: none   SPECIMEN:  Source of Specimen:  Distal R adductor magnus muscle biopsy  DISPOSITION OF SPECIMEN:  PATHOLOGY  COUNTS:  YES  PLAN OF CARE: Discharge to home after PACU  PATIENT DISPOSITION:  PACU - hemodynamically stable.  INDICATION: 55 year old female who presents to my office for evaluation of progressive muscle weakness. She underwent an MRI which showed some edema around her distal right adductor magnus muscle. Her neurologist has asked that we biopsy this.   OR FINDINGS: Normal-appearing anterior thigh compartment  DESCRIPTION: the patient was identified in the preoperative holding area and taken to the OR where they were laid supine on the operating room table.  MAC with LMA anesthesia was induced without difficulty. SCDs were also noted to be in place prior to the initiation of anesthesia.  She was placed in frog leg position.  The patient was then prepped and draped in the usual sterile fashion.   A surgical timeout was performed indicating the correct patient, procedure, positioning and need for preoperative antibiotics.  I began by palpating the gracilis muscle. I injected half percent Marcaine subcutaneously into an area of the medial distal thigh over top of the gracilis.  I then made an approximate 3 cm incision using a 10 blade scalpel. This was carried down through subcutaneous tissue using electrocautery. The anterior thigh fascia was identified and incised also using electrocautery. The gracilis muscle was identified and  swept anteriorly. I then identified an approximate 3 cm portion of muscle tissue posterior to the gracilis. This was resected using Metzenbaum scissors and placed in a saline soaked goals. Hemostasis was then achieved using electrocautery. The fascia was then loosely closed using interrupted 3-0 Vicryl sutures. The subcutaneous dermal layer was closed using interrupted 3-0 Vicryl as well. The skin was closed with a running 4-0 Vicryl subcuticular suture. Skin glue was placed over the incision. The patient was awakened from anesthesia and sent to the postanesthesia care unit in stable condition. All counts were correct per operating room staff.

## 2016-02-08 NOTE — H&P (Addendum)
History of Present Illness Romie Levee MD; 01/09/2016 11:51 AM) Patient words: muscle biopsy.  The patient is a 55 year old female who presents with a complaint of Muscle weakness. 55 year old female who presents to the office as a referral from her neurologist for progressive lower extremity muscle weakness. It was recommended that she undergo a muscle biopsy for further workup.   Other Problems Michel Bickers, LPN; 05/11/1094 04:54 AM) Arthritis Back Pain Gastroesophageal Reflux Disease  Past Surgical History Michel Bickers, LPN; 0/01/8118 14:78 AM) Breast Biopsy Left. Cataract Surgery Bilateral. Colon Polyp Removal - Colonoscopy Spinal Surgery - Neck Tonsillectomy  Diagnostic Studies History Michel Bickers, LPN; 06/15/5619 30:86 AM) Colonoscopy within last year Mammogram 1-3 years ago Pap Smear 1-5 years ago  Allergies Michel Bickers, LPN; 09/10/8467 62:95 AM) No Known Drug Allergies09/09/2015  Medication History Michel Bickers, LPN; 06/13/4130 44:01 AM) Elwyn Reach (18MG  Capsule, Oral) Active. Allegra Allergy (180MG  Tablet, Oral) Active. Medications Reconciled  Social History Michel Bickers, LPN; 0/06/7251 66:44 AM) Alcohol use Moderate alcohol use. Caffeine use Coffee. No drug use Tobacco use Current every day smoker.  Family History Michel Bickers, LPN; 0/07/4740 59:56 AM) Arthritis Brother, Mother. Cancer Brother, Mother. Depression Brother. Heart disease in female family member before age 54 Respiratory Condition Brother.  Pregnancy / Birth History Michel Bickers, LPN; 07/12/7562 33:29 AM) Age at menarche 11 years. Age of menopause [age] Contraceptive History Oral contraceptives. Gravida 0 Para 0    Review of Systems Tresa Endo Dockery LPN; 09/04/8839 66:06 AM) General Present- Fatigue and Night Sweats. Not Present- Appetite Loss, Chills, Fever, Weight Gain and Weight Loss. Skin Present- Dryness. Not Present- Change in Wart/Mole, Hives,  Jaundice, New Lesions, Non-Healing Wounds, Rash and Ulcer. HEENT Present- Seasonal Allergies, Sinus Pain and Wears glasses/contact lenses. Not Present- Earache, Hearing Loss, Hoarseness, Nose Bleed, Oral Ulcers, Ringing in the Ears, Sore Throat, Visual Disturbances and Yellow Eyes. Respiratory Present- Snoring. Not Present- Bloody sputum, Chronic Cough, Difficulty Breathing and Wheezing. Breast Not Present- Breast Mass, Breast Pain, Nipple Discharge and Skin Changes. Cardiovascular Not Present- Chest Pain, Difficulty Breathing Lying Down, Leg Cramps, Palpitations, Rapid Heart Rate, Shortness of Breath and Swelling of Extremities. Gastrointestinal Present- Indigestion. Not Present- Abdominal Pain, Bloating, Bloody Stool, Change in Bowel Habits, Chronic diarrhea, Constipation, Difficulty Swallowing, Excessive gas, Gets full quickly at meals, Hemorrhoids, Nausea, Rectal Pain and Vomiting. Female Genitourinary Not Present- Frequency, Nocturia, Painful Urination, Pelvic Pain and Urgency. Musculoskeletal Present- Joint Pain, Muscle Weakness and Swelling of Extremities. Not Present- Back Pain, Joint Stiffness and Muscle Pain. Neurological Present- Weakness. Not Present- Decreased Memory, Fainting, Headaches, Numbness, Seizures, Tingling, Tremor and Trouble walking. Psychiatric Not Present- Anxiety, Bipolar, Change in Sleep Pattern, Depression, Fearful and Frequent crying. Endocrine Present- Hot flashes. Not Present- Cold Intolerance, Excessive Hunger, Hair Changes, Heat Intolerance and New Diabetes. Hematology Present- Easy Bruising. Not Present- Blood Thinners, Excessive bleeding, Gland problems, HIV and Persistent Infections.  Vitals Tresa Endo Dockery LPN; 3/0/1601 09:32 AM) 01/09/2016 11:28 AM Weight: 168.2 lb Height: 70in Body Surface Area: 1.94 m Body Mass Index: 24.13 kg/m  Temp.: 98.35F(Oral)  Pulse: 72 (Regular)  BP: 118/72 (Sitting, Left Arm, Standard)       Physical Exam Romie Levee MD; 01/09/2016 11:51 AM) General Mental Status-Alert. General Appearance-Not in acute distress. Build & Nutrition-Well nourished. Posture-Normal posture. Gait-Normal.  Head and Neck Head-normocephalic, atraumatic with no lesions or palpable masses. Trachea-midline.  Chest and Lung Exam Chest and lung exam reveals -on auscultation, normal breath sounds, no adventitious sounds and normal vocal resonance.  Cardiovascular Cardiovascular examination reveals -normal heart sounds, regular rate and rhythm with no murmurs.  Abdomen Inspection Inspection of the abdomen reveals - No Hernias. Palpation/Percussion Palpation and Percussion of the abdomen reveal - Soft, Non Tender, No Rigidity (guarding), No hepatosplenomegaly and No Palpable abdominal masses.  Neurologic Neurologic evaluation reveals -alert and oriented x 3 with no impairment of recent or remote memory, normal attention span and ability to concentrate, normal sensation and normal coordination.    Assessment & Plan Romie Levee(Christyl Osentoski MD; 01/09/2016 11:41 AM) MUSCLE WEAKNESS OF EXTREMITY (M62.81) Impression: 55 year old female with progressive muscle weakness who presents to the office for evaluation for muscle biopsy. I think this can be done with minimal complications. I have talked to the patient's neurologist about this and he would like a biopsy of the distal adductor magnus if possible.  We will try to get this for him.  Risks of pain, bleeding and infection were explained to the patient and she has elected to proceed.

## 2016-02-08 NOTE — Discharge Instructions (Signed)
GENERAL SURGERY: POST OP INSTRUCTIONS ° °1. DIET: Follow a light bland diet the first 24 hours after arrival home, such as soup, liquids, crackers, etc.  Be sure to include lots of fluids daily.  Avoid fast food or heavy meals as your are more likely to get nauseated.   °2. Take your usually prescribed home medications unless otherwise directed. °3. PAIN CONTROL: °a. Pain is best controlled by a usual combination of three different methods TOGETHER: °i. Ice/Heat °ii. Over the counter pain medication °iii. Prescription pain medication °b. Most patients will experience some swelling and bruising around the incisions.  Ice packs or heating pads (30-60 minutes up to 6 times a day) will help. Use ice for the first few days to help decrease swelling and bruising, then switch to heat to help relax tight/sore spots and speed recovery.  Some people prefer to use ice alone, heat alone, alternating between ice & heat.  Experiment to what works for you.  Swelling and bruising can take several weeks to resolve.   °c. It is helpful to take an over-the-counter pain medication regularly for the first few weeks.  Choose one of the following that works best for you: °i. Naproxen (Aleve, etc)  Two 220mg tabs twice a day °ii. Ibuprofen (Advil, etc) Three 200mg tabs four times a day (every meal & bedtime) °d. A  prescription for pain medication (such as Percocet, oxycodone, hydrocodone, etc) should be given to you upon discharge.  Take your pain medication as prescribed.  °i. If you are having problems/concerns with the prescription medicine (does not control pain, nausea, vomiting, rash, itching, etc), please call us [phone] to see if we need to switch you to a different pain medicine that will work better for you and/or control your side effect better. °ii. If you need a refill on your pain medication, please contact your pharmacy.  They will contact our office to request authorization. Prescriptions will not be filled after 5  pm or on week-ends. °4. Avoid getting constipated.  Between the surgery and the pain medications, it is common to experience some constipation.  Increasing fluid intake and taking a fiber supplement (such as Metamucil, Citrucel, FiberCon, MiraLax, etc) 1-2 times a day regularly will usually help prevent this problem from occurring.  A mild laxative (prune juice, Milk of Magnesia, MiraLax, etc) should be taken according to package directions if there are no bowel movements after 48 hours.   °5. Wash / shower every day.  You may shower over the dressings as they are waterproof.  Continue to shower over incision(s) after the dressing is off. °6. Remove your waterproof bandages 5 days after surgery.  You may leave the incision open to air.  You may have skin tapes (Steri Strips) covering the incision(s).  Leave them on until one week, then remove.  You may replace a dressing/Band-Aid to cover the incision for comfort if you wish.  ° ° ° ° °7. ACTIVITIES as tolerated:   °a. You may resume regular (light) daily activities beginning the next day--such as daily self-care, walking, climbing stairs--gradually increasing activities as tolerated.  If you can walk 30 minutes without difficulty, it is safe to try more intense activity such as jogging, treadmill, bicycling, low-impact aerobics, swimming, etc. °b. Save the most intensive and strenuous activity for last such as sit-ups, heavy lifting, contact sports, etc  Refrain from any heavy lifting or straining until you are off narcotics for pain control.   °c. DO NOT PUSH   THROUGH PAIN.  Let pain be your guide: If it hurts to do something, don't do it.  Pain is your body warning you to avoid that activity for another week until the pain goes down. °d. You may drive when you are no longer taking prescription pain medication, you can comfortably wear a seatbelt, and you can safely maneuver your car and apply brakes. °e. You may have sexual intercourse when it is comfortable.   °8. FOLLOW UP in our office °a. Please call [facility] at [phone] to set up an appointment to see your surgeon in the office for a follow-up appointment approximately 2-3 weeks after your surgery. °b. Make sure that you call for this appointment the day you arrive home to insure a convenient appointment time. °9. IF YOU HAVE DISABILITY OR FAMILY LEAVE FORMS, BRING THEM TO THE OFFICE FOR PROCESSING.  DO NOT GIVE THEM TO YOUR DOCTOR. ° ° °WHEN TO CALL US [phone]: °1. Poor pain control °2. Reactions / problems with new medications (rash/itching, nausea, etc)  °3. Fever over 101.5 F (38.5 C) °4. Worsening swelling or bruising °5. Continued bleeding from incision. °6. Increased pain, redness, or drainage from the incision ° ° The clinic staff is available to answer your questions during regular business hours (8:30am-5pm).  Please don’t hesitate to call and ask to speak to one of our nurses for clinical concerns.  ° If you have a medical emergency, go to the nearest emergency room or call 911. ° A surgeon from [facility] is always on call at the hospitals ° ° °[facility] °[address], [address], [place], [place]  [address] ? °MAIN: [phone] ? TOLL FREE: [phone] ?  °FAX [phone] °[email or web address] ° ° °

## 2016-02-08 NOTE — Anesthesia Procedure Notes (Signed)
Procedure Name: LMA Insertion Date/Time: 02/08/2016 7:48 AM Performed by: Illene SilverEVANS, Jayleen Afonso E Pre-anesthesia Checklist: Patient identified, Emergency Drugs available, Suction available and Patient being monitored Patient Re-evaluated:Patient Re-evaluated prior to inductionOxygen Delivery Method: Circle system utilized Preoxygenation: Pre-oxygenation with 100% oxygen Intubation Type: IV induction Ventilation: Mask ventilation without difficulty LMA Size: 4.0 Tube type: Oral (oral sump to stomach and decompressed) Number of attempts: 1 Placement Confirmation: positive ETCO2 Tube secured with: Tape Dental Injury: Teeth and Oropharynx as per pre-operative assessment

## 2016-02-08 NOTE — Transfer of Care (Signed)
Immediate Anesthesia Transfer of Care Note  Patient: Rose Blair  Procedure(s) Performed: Procedure(s): RIGHT THIGH ADDUCTOR MAGNUS MUSCLE BIOPSY (Right)  Patient Location: PACU  Anesthesia Type:General  Level of Consciousness: awake, alert , oriented and patient cooperative  Airway & Oxygen Therapy: Patient Spontanous Breathing and Patient connected to face mask oxygen  Post-op Assessment: Report given to RN, Post -op Vital signs reviewed and stable and Patient moving all extremities X 4  Post vital signs: stable  Last Vitals:  Vitals:   02/08/16 0531 02/08/16 0830  BP: 133/72 120/69  Pulse: 77 60  Resp: 18 15  Temp: 36.7 C     Last Pain:  Vitals:   02/08/16 0531  TempSrc: Oral      Patients Stated Pain Goal: 3 (02/08/16 0531)  Complications: No apparent anesthesia complications

## 2016-02-22 ENCOUNTER — Encounter (HOSPITAL_COMMUNITY): Payer: Self-pay

## 2020-03-18 ENCOUNTER — Ambulatory Visit: Payer: Commercial Managed Care - PPO | Attending: Internal Medicine

## 2020-03-18 DIAGNOSIS — Z23 Encounter for immunization: Secondary | ICD-10-CM

## 2020-03-18 NOTE — Progress Notes (Signed)
   Covid-19 Vaccination Clinic  Name:  Rose Blair    MRN: [id number] DOB: Apr 13, 1961  03/18/2020  [name] was observed post Covid-19 immunization for 15 minutes without incident. She was provided with Vaccine Information Sheet and instruction to access the V-Safe system.   [name] was instructed to call 911 with any severe reactions post vaccine: Marland Kitchen Difficulty breathing  . Swelling of face and throat  . A fast heartbeat  . A bad rash all over body  . Dizziness and weakness

## 2020-12-20 ENCOUNTER — Other Ambulatory Visit: Payer: Self-pay | Admitting: Gastroenterology

## 2020-12-20 ENCOUNTER — Other Ambulatory Visit (HOSPITAL_COMMUNITY): Payer: Self-pay | Admitting: Gastroenterology

## 2020-12-20 DIAGNOSIS — R1011 Right upper quadrant pain: Secondary | ICD-10-CM

## 2021-01-03 ENCOUNTER — Other Ambulatory Visit: Payer: Self-pay

## 2021-01-03 ENCOUNTER — Ambulatory Visit (HOSPITAL_COMMUNITY)
Admission: RE | Admit: 2021-01-03 | Discharge: 2021-01-03 | Disposition: A | Discharge status: Home / Self Care | Payer: Commercial Managed Care - PPO | Source: Ambulatory Visit | Attending: Gastroenterology | Admitting: Gastroenterology

## 2021-01-03 ENCOUNTER — Encounter (HOSPITAL_COMMUNITY)
Admission: RE | Admit: 2021-01-03 | Discharge: 2021-01-03 | Disposition: A | Discharge status: Home / Self Care | Payer: Commercial Managed Care - PPO | Source: Ambulatory Visit | Attending: Gastroenterology | Admitting: Gastroenterology

## 2021-01-03 DIAGNOSIS — R1011 Right upper quadrant pain: Secondary | ICD-10-CM | POA: Diagnosis present

## 2021-01-03 MED ORDER — TECHNETIUM TC 99M MEBROFENIN IV KIT
5.3000 | PACK | Freq: Once | INTRAVENOUS | Status: AC | PRN
  Administered 2021-01-03: 5.3 via INTRAVENOUS

## 2021-02-06 ENCOUNTER — Ambulatory Visit: Payer: Self-pay | Admitting: General Surgery

## 2021-02-06 NOTE — H&P (View-Only) (Signed)
vChief Complaint: gallbladder       History of Present Illness: Rose Blair is a 60 y.o. female who is seen today as an office consultation at the request of Dr. Loreta Ave for evaluation of gallbladder .     Patient is a 60 year old female who comes in secondary to right upper quadrant abdominal pain.  She states that the pain has been ongoing over the last several months.  She states that she usually has pain after meals.  There are times without meals she does not have pain.  She has had some bloating sensation.  Patient was seen by Dr. Loreta Ave and evaluated.  She underwent HIDA scan which was significant for low ejection fraction.  Patient also had pain with Ensure.  Patient had an ultrasound which was normal without any gallstones.  I did review the studies personally.     Review of Systems: A complete review of systems was obtained from the patient.  I have reviewed this information and discussed as appropriate with the patient.  See HPI as well for other ROS.   Review of Systems  Constitutional: Negative for fever.  HENT: Negative for congestion.   Eyes: Negative for blurred vision.  Respiratory: Negative for cough, shortness of breath and wheezing.   Cardiovascular: Negative for chest pain and palpitations.  Gastrointestinal: Negative for heartburn.  Genitourinary: Negative for dysuria.  Musculoskeletal: Negative for myalgias.  Skin: Negative for rash.  Neurological: Negative for dizziness and headaches.  Psychiatric/Behavioral: Negative for depression and suicidal ideas.  All other systems reviewed and are negative.       Medical History: Past Medical History Past Medical History: Diagnosis Date  Arthritis        There is no problem list on file for this patient.     Past Surgical History History reviewed. No pertinent surgical history.     Allergies Allergies Allergen Reactions  Codeine Nausea And Vomiting      Current Outpatient Medications on File Prior to  Visit Medication Sig Dispense Refill  albuterol 90 mcg/actuation inhaler albuterol sulfate HFA 90 mcg/actuation aerosol inhaler      diclofenac (VOLTAREN) 25 MG EC tablet diclofenac sodium 25 mg tablet,delayed release      montelukast (SINGULAIR) 10 mg tablet Take 1 tablet by mouth once daily      artificial tears (GONIOSCOP-HPM) 2.5 % ophthalmic solution Apply to eye      azithromycin (ZITHROMAX) 250 MG tablet TAKE 2 TABLETS BY MOUTH ON DAY 1, AND THEN TAKE 1 TABLET BY MOUTH ONCE A DAY ON DAY 2 THROUGH DAY 5      calcium carbonate-vitamin D3 (OYSTER SHELL CALCIUM-VIT D3) 500 mg-5 mcg (200 unit) tablet Take 1 tablet by mouth once daily      fexofenadine-pseudoephedrine (ALLEGRA-D 24H) 180-240 mg ER tablet Take 1 tablet by mouth as needed      lifitegrast (XIIDRA) 5 % ophthalmic solution Xiidra 5 % eye drops in a dropperette      omega 3-dha-epa-fish oil (FISH OIL) 100-160-1,000 mg Cap Fish Oil      omega-3s/dha/epa/fish oil/D3 (VITAMIN-D + OMEGA-3 ORAL) Vitamin D      omeprazole (PRILOSEC) 40 MG DR capsule 1 CAP(S) ORALLY 20 MINUTES BEFORE BREAKFAST 90 DAYS      ondansetron (ZOFRAN-ODT) 8 MG disintegrating tablet TAKE 1 TABLET BY MOUTH THREE TIMES A DAY FOR 30 DAYS      predniSONE (DELTASONE) 20 MG tablet Take 20 mg by mouth once daily      pseudoephed-DM-acetaminophen 30-15-500  mg Tab Take by mouth      vitamin B12-folic acid 0.5-1 mg Tab Vitamin B12       No current facility-administered medications on file prior to visit.     Family History Family History Family history unknown: Yes      Social History   Tobacco Use Smoking Status Smoker, Current Status Unknown Smokeless Tobacco Never Used     Social History Social History    Socioeconomic History  Marital status: Married Tobacco Use  Smoking status: Smoker, Current Status Unknown  Smokeless tobacco: Never Used Substance and Sexual Activity  Alcohol use: Yes  Drug use: Never      Objective:     Vitals:    02/06/21 1508 BP: 136/80 Pulse: 98 Temp: 36.5 C (97.7 F) SpO2: 95% Weight: 77.5 kg (170 lb 12.8 oz) Height: 177.8 cm (5\' 10" )   Body mass index is 24.51 kg/m.   Physical Exam Constitutional:      Appearance: Normal appearance.  HENT:     Head: Normocephalic and atraumatic.     Mouth/Throat:     Mouth: Mucous membranes are moist.     Pharynx: Oropharynx is clear.  Eyes:     General: No scleral icterus.    Pupils: Pupils are equal, round, and reactive to light.  Cardiovascular:     Rate and Rhythm: Normal rate and regular rhythm.     Pulses: Normal pulses.     Heart sounds: No murmur heard.   No friction rub. No gallop.  Pulmonary:     Effort: Pulmonary effort is normal. No respiratory distress.     Breath sounds: Normal breath sounds. No stridor.  Abdominal:     General: Abdomen is flat.  Musculoskeletal:        General: No swelling.  Skin:    General: Skin is warm.  Neurological:     General: No focal deficit present.     Mental Status: She is alert and oriented to person, place, and time. Mental status is at baseline.  Psychiatric:        Mood and Affect: Mood normal.        Thought Content: Thought content normal.        Judgment: Judgment normal.        Assessment and Plan: Diagnoses and all orders for this visit:   Biliary dyskinesia     Rose Blair is a 60 y.o. female    1.  We will proceed to the OR for a lap cholecystectomy. 2. All risks and benefits were discussed with the patient to generally include: infection, bleeding, possible need for post op ERCP, damage to the bile ducts, and bile leak. Alternatives were offered and described.  All questions were answered and the patient voiced understanding of the procedure and wishes to proceed at this point with a laparoscopic cholecystectomy           No follow-ups on file.   66, MD, [facility], [facility] General & Minimally Invasive Surgery

## 2021-02-06 NOTE — H&P (Signed)
vChief Complaint: gallbladder       History of Present Illness: Rose Blair is a 60 y.o. female who is seen today as an office consultation at the request of Dr. Loreta Ave for evaluation of gallbladder .     Patient is a 60 year old female who comes in secondary to right upper quadrant abdominal pain.  She states that the pain has been ongoing over the last several months.  She states that she usually has pain after meals.  There are times without meals she does not have pain.  She has had some bloating sensation.  Patient was seen by Dr. Loreta Ave and evaluated.  She underwent HIDA scan which was significant for low ejection fraction.  Patient also had pain with Ensure.  Patient had an ultrasound which was normal without any gallstones.  I did review the studies personally.     Review of Systems: A complete review of systems was obtained from the patient.  I have reviewed this information and discussed as appropriate with the patient.  See HPI as well for other ROS.   Review of Systems  Constitutional: Negative for fever.  HENT: Negative for congestion.   Eyes: Negative for blurred vision.  Respiratory: Negative for cough, shortness of breath and wheezing.   Cardiovascular: Negative for chest pain and palpitations.  Gastrointestinal: Negative for heartburn.  Genitourinary: Negative for dysuria.  Musculoskeletal: Negative for myalgias.  Skin: Negative for rash.  Neurological: Negative for dizziness and headaches.  Psychiatric/Behavioral: Negative for depression and suicidal ideas.  All other systems reviewed and are negative.       Medical History: Past Medical History Past Medical History: Diagnosis Date  Arthritis        There is no problem list on file for this patient.     Past Surgical History History reviewed. No pertinent surgical history.     Allergies Allergies Allergen Reactions  Codeine Nausea And Vomiting      Current Outpatient Medications on File Prior to  Visit Medication Sig Dispense Refill  albuterol 90 mcg/actuation inhaler albuterol sulfate HFA 90 mcg/actuation aerosol inhaler      diclofenac (VOLTAREN) 25 MG EC tablet diclofenac sodium 25 mg tablet,delayed release      montelukast (SINGULAIR) 10 mg tablet Take 1 tablet by mouth once daily      artificial tears (GONIOSCOP-HPM) 2.5 % ophthalmic solution Apply to eye      azithromycin (ZITHROMAX) 250 MG tablet TAKE 2 TABLETS BY MOUTH ON DAY 1, AND THEN TAKE 1 TABLET BY MOUTH ONCE A DAY ON DAY 2 THROUGH DAY 5      calcium carbonate-vitamin D3 (OYSTER SHELL CALCIUM-VIT D3) 500 mg-5 mcg (200 unit) tablet Take 1 tablet by mouth once daily      fexofenadine-pseudoephedrine (ALLEGRA-D 24H) 180-240 mg ER tablet Take 1 tablet by mouth as needed      lifitegrast (XIIDRA) 5 % ophthalmic solution Xiidra 5 % eye drops in a dropperette      omega 3-dha-epa-fish oil (FISH OIL) 100-160-1,000 mg Cap Fish Oil      omega-3s/dha/epa/fish oil/D3 (VITAMIN-D + OMEGA-3 ORAL) Vitamin D      omeprazole (PRILOSEC) 40 MG DR capsule 1 CAP(S) ORALLY 20 MINUTES BEFORE BREAKFAST 90 DAYS      ondansetron (ZOFRAN-ODT) 8 MG disintegrating tablet TAKE 1 TABLET BY MOUTH THREE TIMES A DAY FOR 30 DAYS      predniSONE (DELTASONE) 20 MG tablet Take 20 mg by mouth once daily      pseudoephed-DM-acetaminophen 30-15-500  mg Tab Take by mouth      vitamin B12-folic acid 0.5-1 mg Tab Vitamin B12       No current facility-administered medications on file prior to visit.     Family History Family History Family history unknown: Yes      Social History   Tobacco Use Smoking Status Smoker, Current Status Unknown Smokeless Tobacco Never Used     Social History Social History    Socioeconomic History  Marital status: Married Tobacco Use  Smoking status: Smoker, Current Status Unknown  Smokeless tobacco: Never Used Substance and Sexual Activity  Alcohol use: Yes  Drug use: Never      Objective:     Vitals:    02/06/21 1508 BP: 136/80 Pulse: 98 Temp: 36.5 C (97.7 F) SpO2: 95% Weight: 77.5 kg (170 lb 12.8 oz) Height: 177.8 cm (5\' 10" )   Body mass index is 24.51 kg/m.   Physical Exam Constitutional:      Appearance: Normal appearance.  HENT:     Head: Normocephalic and atraumatic.     Mouth/Throat:     Mouth: Mucous membranes are moist.     Pharynx: Oropharynx is clear.  Eyes:     General: No scleral icterus.    Pupils: Pupils are equal, round, and reactive to light.  Cardiovascular:     Rate and Rhythm: Normal rate and regular rhythm.     Pulses: Normal pulses.     Heart sounds: No murmur heard.   No friction rub. No gallop.  Pulmonary:     Effort: Pulmonary effort is normal. No respiratory distress.     Breath sounds: Normal breath sounds. No stridor.  Abdominal:     General: Abdomen is flat.  Musculoskeletal:        General: No swelling.  Skin:    General: Skin is warm.  Neurological:     General: No focal deficit present.     Mental Status: She is alert and oriented to person, place, and time. Mental status is at baseline.  Psychiatric:        Mood and Affect: Mood normal.        Thought Content: Thought content normal.        Judgment: Judgment normal.        Assessment and Plan: Diagnoses and all orders for this visit:   Biliary dyskinesia     Rose Blair is a 60 y.o. female    1.  We will proceed to the OR for a lap cholecystectomy. 2. All risks and benefits were discussed with the patient to generally include: infection, bleeding, possible need for post op ERCP, damage to the bile ducts, and bile leak. Alternatives were offered and described.  All questions were answered and the patient voiced understanding of the procedure and wishes to proceed at this point with a laparoscopic cholecystectomy           No follow-ups on file.   66, MD, [facility], [facility] General & Minimally Invasive Surgery

## 2021-02-12 NOTE — Patient Instructions (Signed)
DUE TO COVID-19 ONLY ONE VISITOR IS ALLOWED TO COME WITH YOU AND STAY IN THE WAITING ROOM ONLY DURING PRE OP AND PROCEDURE.   **NO VISITORS ARE ALLOWED IN THE SHORT STAY AREA OR RECOVERY ROOM!!**  IF YOU WILL BE ADMITTED INTO THE HOSPITAL YOU ARE ALLOWED ONLY TWO SUPPORT PEOPLE DURING VISITATION HOURS ONLY (10AM -8PM)   The support person(s) may change daily. The support person(s) must pass our screening, gel in and out, and wear a mask at all times, including in the patient's room. Patients must also wear a mask when staff or their support person are in the room.  No visitors under the age of 74. Any visitor under the age of 59 must be accompanied by an adult.        Your procedure is scheduled on: 02/14/21   Report to [facility] Main Entrance    Report to admitting at : 8:30 AM.   Call this number if you have problems the morning of surgery [phone]   Do not eat food :After Midnight.   May have liquids until: 7:45 AM   day of surgery  CLEAR LIQUID DIET  Foods Allowed                                                                     Foods Excluded  Water, Black Coffee and tea, regular and decaf                             liquids that you cannot  Plain Jell-O in any flavor  (No red)                                           see through such as: Fruit ices (not with fruit pulp)                                     milk, soups, orange juice              Iced Popsicles (No red)                                    All solid food                                   Apple juices Sports drinks like Gatorade (No red) Lightly seasoned clear broth or consume(fat free) Sugar,   Sample Menu Breakfast                                Lunch                                     Supper Cranberry juice  Beef broth                            Chicken broth Jell-O                                     Grape juice                           Apple juice Coffee or tea                         Jell-O                                      Popsicle                                                Coffee or tea                        Coffee or tea      Complete one Ensure drink the morning of surgery at : 7:45 AM      the day of surgery.  The day of surgery:  Drink ONE (1) Pre-Surgery Clear Ensure or G2 by am the morning of surgery. Drink in one sitting. Do not sip.  This drink was given to you during your hospital  pre-op appointment visit. Nothing else to drink after completing the  Pre-Surgery Clear Ensure or G2.          If you have questions, please contact your surgeon's office.     Oral Hygiene is also important to reduce your risk of infection.                                    Remember - BRUSH YOUR TEETH THE MORNING OF SURGERY WITH YOUR REGULAR TOOTHPASTE   Do NOT smoke after Midnight   Take these medicines the morning of surgery with A SIP OF WATER: omeprazole.Use inhalers and eye drops as usual.                              You may not have any metal on your body including hair pins, jewelry, and body piercing             Do not wear make-up, lotions, powders, perfumes/cologne, or deodorant  Do not wear nail polish including gel and S&S, artificial/acrylic nails, or any other type of covering on natural nails including finger and toenails. If you have artificial nails, gel coating, etc. that needs to be removed by a nail salon please have this removed prior to surgery or surgery may need to be canceled/ delayed if the surgeon/ anesthesia feels like they are unable to be safely monitored.   Do not shave  48 hours prior to surgery.    Do not bring valuables to the hospital. [facility] IS NOT  RESPONSIBLE   FOR VALUABLES.   Contacts, dentures or bridgework may not be worn into surgery.   Bring small overnight bag day of surgery.    Patients discharged on the day of surgery will not be allowed to drive home.   Special Instructions: Bring  a copy of your healthcare power of attorney and living will documents         the day of surgery if you haven't scanned them before.              Please read over the following fact sheets you were given: IF YOU HAVE QUESTIONS ABOUT YOUR PRE-OP INSTRUCTIONS PLEASE CALL [phone]   [facility] - Preparing for Surgery Before surgery, you can play an important role.  Because skin is not sterile, your skin needs to be as free of germs as possible.  You can reduce the number of germs on your skin by washing with CHG (chlorahexidine gluconate) soap before surgery.  CHG is an antiseptic cleaner which kills germs and bonds with the skin to continue killing germs even after washing. Please DO NOT use if you have an allergy to CHG or antibacterial soaps.  If your skin becomes reddened/irritated stop using the CHG and inform your nurse when you arrive at Short Stay. Do not shave (including legs and underarms) for at least 48 hours prior to the first CHG shower.  You may shave your face/neck. Please follow these instructions carefully:  1.  Shower with CHG Soap the night before surgery and the  morning of Surgery.  2.  If you choose to wash your hair, wash your hair first as usual with your  normal  shampoo.  3.  After you shampoo, rinse your hair and body thoroughly to remove the  shampoo.                           4.  Use CHG as you would any other liquid soap.  You can apply chg directly  to the skin and wash                       Gently with a scrungie or clean washcloth.  5.  Apply the CHG Soap to your body ONLY FROM THE NECK DOWN.   Do not use on face/ open                           Wound or open sores. Avoid contact with eyes, ears mouth and genitals (private parts).                       Wash face,  Genitals (private parts) with your normal soap.             6.  Wash thoroughly, paying special attention to the area where your surgery  will be performed.  7.  Thoroughly rinse your body with warm water  from the neck down.  8.  DO NOT shower/wash with your normal soap after using and rinsing off  the CHG Soap.                9.  Pat yourself dry with a clean towel.            10.  Wear clean pajamas.            11.  Place clean sheets on your  bed the night of your first shower and do not  sleep with pets. Day of Surgery : Do not apply any lotions/deodorants the morning of surgery.  Please wear clean clothes to the hospital/surgery center.  FAILURE TO FOLLOW THESE INSTRUCTIONS MAY RESULT IN THE CANCELLATION OF YOUR SURGERY PATIENT SIGNATURE_________________________________  NURSE SIGNATURE__________________________________  ________________________________________________________________________

## 2021-02-13 ENCOUNTER — Other Ambulatory Visit: Payer: Self-pay

## 2021-02-13 ENCOUNTER — Encounter (HOSPITAL_COMMUNITY)
Admission: RE | Admit: 2021-02-13 | Discharge: 2021-02-13 | Disposition: A | Discharge status: Home / Self Care | Payer: Commercial Managed Care - PPO | Source: Ambulatory Visit | Attending: General Surgery | Admitting: General Surgery

## 2021-02-13 ENCOUNTER — Encounter (HOSPITAL_COMMUNITY): Payer: Self-pay

## 2021-02-13 DIAGNOSIS — Z01818 Encounter for other preprocedural examination: Secondary | ICD-10-CM | POA: Diagnosis present

## 2021-02-13 LAB — CBC
HCT: 43 % (ref 36.0–46.0)
Hemoglobin: 14.5 g/dL (ref 12.0–15.0)
MCH: 34.2 pg — ABNORMAL HIGH (ref 26.0–34.0)
MCHC: 33.7 g/dL (ref 30.0–36.0)
MCV: 101.4 fL — ABNORMAL HIGH (ref 80.0–100.0)
Platelets: 266 10*3/uL (ref 150–400)
RBC: 4.24 MIL/uL (ref 3.87–5.11)
RDW: 12.6 % (ref 11.5–15.5)
WBC: 8 10*3/uL (ref 4.0–10.5)
nRBC: 0 % (ref 0.0–0.2)

## 2021-02-13 NOTE — Progress Notes (Addendum)
COVID Vaccine Completed: Yes Date COVID Vaccine completed: 03/18/20. X 3 COVID vaccine manufacturer: Pfizer     COVID Test: N/A  PCP - Zoe Lan: NP Cardiologist -   Chest x-ray -  EKG -  Stress Test -  ECHO -  Cardiac Cath -  Pacemaker/ICD device last checked:  Sleep Study -  CPAP -   Fasting Blood Sugar -  Checks Blood Sugar _____ times a day  Blood Thinner Instructions: Aspirin Instructions: Last Dose:  Anesthesia review: Hx: Smoker   Patient denies shortness of breath, fever, cough and chest pain at PAT appointment   Patient verbalized understanding of instructions that were given to them at the PAT appointment. Patient was also instructed that they will need to review over the PAT instructions again at home before surgery.

## 2021-02-14 ENCOUNTER — Ambulatory Visit (HOSPITAL_COMMUNITY): Payer: Commercial Managed Care - PPO | Admitting: Anesthesiology

## 2021-02-14 ENCOUNTER — Ambulatory Visit (HOSPITAL_COMMUNITY)
Admission: RE | Admit: 2021-02-14 | Discharge: 2021-02-14 | Disposition: A | Discharge status: Home / Self Care | Payer: Commercial Managed Care - PPO | Source: Ambulatory Visit | Attending: General Surgery | Admitting: General Surgery

## 2021-02-14 ENCOUNTER — Encounter (HOSPITAL_COMMUNITY): Payer: Self-pay | Admitting: General Surgery

## 2021-02-14 ENCOUNTER — Encounter (HOSPITAL_COMMUNITY)
Admission: RE | Disposition: A | Discharge status: Home / Self Care | Payer: Self-pay | Source: Ambulatory Visit | Attending: General Surgery

## 2021-02-14 DIAGNOSIS — K811 Chronic cholecystitis: Secondary | ICD-10-CM | POA: Diagnosis not present

## 2021-02-14 DIAGNOSIS — F17200 Nicotine dependence, unspecified, uncomplicated: Secondary | ICD-10-CM | POA: Diagnosis not present

## 2021-02-14 DIAGNOSIS — Z885 Allergy status to narcotic agent status: Secondary | ICD-10-CM | POA: Insufficient documentation

## 2021-02-14 DIAGNOSIS — K828 Other specified diseases of gallbladder: Secondary | ICD-10-CM | POA: Diagnosis present

## 2021-02-14 HISTORY — PX: CHOLECYSTECTOMY: SHX55

## 2021-02-14 SURGERY — LAPAROSCOPIC CHOLECYSTECTOMY
Anesthesia: General | Site: Abdomen

## 2021-02-14 MED ORDER — ENSURE PRE-SURGERY PO LIQD: 296.0000 mL | Freq: Once | ORAL | Status: DC

## 2021-02-14 MED ORDER — CHLORHEXIDINE GLUCONATE 0.12 % MT SOLN
15.0000 mL | Freq: Once | OROMUCOSAL | Status: AC
  Administered 2021-02-14: 15 mL via OROMUCOSAL

## 2021-02-14 MED ORDER — KETOROLAC TROMETHAMINE 30 MG/ML IJ SOLN
INTRAMUSCULAR | Status: DC | PRN
  Administered 2021-02-14: 30 mg via INTRAVENOUS

## 2021-02-14 MED ORDER — PHENYLEPHRINE HCL (PRESSORS) 10 MG/ML IV SOLN
INTRAVENOUS | Status: AC
  Filled 2021-02-14: qty 2

## 2021-02-14 MED ORDER — FENTANYL CITRATE (PF) 100 MCG/2ML IJ SOLN
INTRAMUSCULAR | Status: AC
  Filled 2021-02-14: qty 2

## 2021-02-14 MED ORDER — PROPOFOL 10 MG/ML IV BOLUS
INTRAVENOUS | Status: DC | PRN
  Administered 2021-02-14: 140 mg via INTRAVENOUS

## 2021-02-14 MED ORDER — LIDOCAINE 2% (20 MG/ML) 5 ML SYRINGE
INTRAMUSCULAR | Status: DC | PRN
  Administered 2021-02-14: 60 mg via INTRAVENOUS

## 2021-02-14 MED ORDER — LIDOCAINE HCL (PF) 2 % IJ SOLN
INTRAMUSCULAR | Status: DC | PRN
  Administered 2021-02-14: 1.5 mg/kg/h via INTRADERMAL

## 2021-02-14 MED ORDER — SUGAMMADEX SODIUM 200 MG/2ML IV SOLN
INTRAVENOUS | Status: DC | PRN
  Administered 2021-02-14: 200 mg via INTRAVENOUS

## 2021-02-14 MED ORDER — KETOROLAC TROMETHAMINE 30 MG/ML IJ SOLN: 30.0000 mg | Freq: Once | INTRAMUSCULAR | Status: DC | PRN

## 2021-02-14 MED ORDER — ONDANSETRON HCL 4 MG/2ML IJ SOLN
INTRAMUSCULAR | Status: DC | PRN
  Administered 2021-02-14: 4 mg via INTRAVENOUS

## 2021-02-14 MED ORDER — ACETAMINOPHEN 500 MG PO TABS
1000.0000 mg | ORAL_TABLET | ORAL | Status: AC
  Administered 2021-02-14: 1000 mg via ORAL
  Filled 2021-02-14: qty 2

## 2021-02-14 MED ORDER — DEXAMETHASONE SODIUM PHOSPHATE 10 MG/ML IJ SOLN
INTRAMUSCULAR | Status: AC
  Filled 2021-02-14: qty 1

## 2021-02-14 MED ORDER — DEXAMETHASONE SODIUM PHOSPHATE 10 MG/ML IJ SOLN
INTRAMUSCULAR | Status: DC | PRN
  Administered 2021-02-14: 8 mg via INTRAVENOUS

## 2021-02-14 MED ORDER — DEXMEDETOMIDINE (PRECEDEX) IN NS 20 MCG/5ML (4 MCG/ML) IV SYRINGE
PREFILLED_SYRINGE | INTRAVENOUS | Status: AC
  Filled 2021-02-14: qty 5

## 2021-02-14 MED ORDER — TRAMADOL HCL 50 MG PO TABS: 50.0000 mg | ORAL_TABLET | Freq: Four times a day (QID) | ORAL | 0 refills | Status: AC | PRN

## 2021-02-14 MED ORDER — BUPIVACAINE-EPINEPHRINE (PF) 0.25% -1:200000 IJ SOLN
INTRAMUSCULAR | Status: AC
  Filled 2021-02-14: qty 30

## 2021-02-14 MED ORDER — KETAMINE HCL 10 MG/ML IJ SOLN
INTRAMUSCULAR | Status: DC | PRN
  Administered 2021-02-14: 30 mg via INTRAVENOUS
  Administered 2021-02-14: 10 mg via INTRAVENOUS

## 2021-02-14 MED ORDER — KETAMINE HCL 10 MG/ML IJ SOLN
INTRAMUSCULAR | Status: AC
  Filled 2021-02-14: qty 1

## 2021-02-14 MED ORDER — CEFAZOLIN SODIUM-DEXTROSE 2-4 GM/100ML-% IV SOLN
2.0000 g | INTRAVENOUS | Status: AC
  Administered 2021-02-14: 2 g via INTRAVENOUS
  Filled 2021-02-14: qty 100

## 2021-02-14 MED ORDER — MIDAZOLAM HCL 5 MG/5ML IJ SOLN
INTRAMUSCULAR | Status: DC | PRN
  Administered 2021-02-14: 2 mg via INTRAVENOUS

## 2021-02-14 MED ORDER — ONDANSETRON HCL 4 MG/2ML IJ SOLN: 4.0000 mg | Freq: Once | INTRAMUSCULAR | Status: DC | PRN

## 2021-02-14 MED ORDER — MIDAZOLAM HCL 2 MG/2ML IJ SOLN
INTRAMUSCULAR | Status: AC
  Filled 2021-02-14: qty 2

## 2021-02-14 MED ORDER — PROPOFOL 10 MG/ML IV BOLUS
INTRAVENOUS | Status: AC
  Filled 2021-02-14: qty 20

## 2021-02-14 MED ORDER — 0.9 % SODIUM CHLORIDE (POUR BTL) OPTIME
TOPICAL | Status: DC | PRN
  Administered 2021-02-14: 1000 mL

## 2021-02-14 MED ORDER — ORAL CARE MOUTH RINSE: 15.0000 mL | Freq: Once | OROMUCOSAL | Status: AC

## 2021-02-14 MED ORDER — ROCURONIUM BROMIDE 10 MG/ML (PF) SYRINGE
PREFILLED_SYRINGE | INTRAVENOUS | Status: AC
  Filled 2021-02-14: qty 10

## 2021-02-14 MED ORDER — LIDOCAINE HCL (PF) 2 % IJ SOLN
INTRAMUSCULAR | Status: AC
  Filled 2021-02-14: qty 5

## 2021-02-14 MED ORDER — HYDROMORPHONE HCL 1 MG/ML IJ SOLN: 0.2500 mg | INTRAMUSCULAR | Status: DC | PRN

## 2021-02-14 MED ORDER — LACTATED RINGERS IR SOLN
Status: DC | PRN
  Administered 2021-02-14: 1000 mL

## 2021-02-14 MED ORDER — LACTATED RINGERS IV SOLN
INTRAVENOUS | Status: DC
Start: 2021-02-14 — End: 2021-02-14

## 2021-02-14 MED ORDER — BUPIVACAINE-EPINEPHRINE (PF) 0.25% -1:200000 IJ SOLN
INTRAMUSCULAR | Status: DC | PRN
  Administered 2021-02-14: 10 mL

## 2021-02-14 MED ORDER — LABETALOL HCL 5 MG/ML IV SOLN
INTRAVENOUS | Status: AC
  Filled 2021-02-14: qty 4

## 2021-02-14 MED ORDER — CHLORHEXIDINE GLUCONATE CLOTH 2 % EX PADS: 6.0000 | MEDICATED_PAD | Freq: Once | CUTANEOUS | Status: DC

## 2021-02-14 MED ORDER — ROCURONIUM BROMIDE 10 MG/ML (PF) SYRINGE
PREFILLED_SYRINGE | INTRAVENOUS | Status: DC | PRN
  Administered 2021-02-14: 60 mg via INTRAVENOUS

## 2021-02-14 SURGICAL SUPPLY — 41 items
ADH SKN CLS APL DERMABOND .7 (GAUZE/BANDAGES/DRESSINGS) ×1
APL PRP STRL LF DISP 70% ISPRP (MISCELLANEOUS) ×1
APPLIER CLIP 5 13 M/L LIGAMAX5 (MISCELLANEOUS)
APR CLP MED LRG 5 ANG JAW (MISCELLANEOUS)
BAG COUNTER SPONGE SURGICOUNT (BAG) IMPLANT
BAG SPEC RTRVL 10 TROC 200 (ENDOMECHANICALS) ×1
BAG SPNG CNTER NS LX DISP (BAG)
CABLE HIGH FREQUENCY MONO STRZ (ELECTRODE) ×2 IMPLANT
CHLORAPREP W/TINT 26 (MISCELLANEOUS) ×2 IMPLANT
CLIP APPLIE 5 13 M/L LIGAMAX5 (MISCELLANEOUS) IMPLANT
CLIP LIGATING HEMO O LOK GREEN (MISCELLANEOUS) ×2 IMPLANT
COVER MAYO STAND STRL (DRAPES) ×2 IMPLANT
COVER TRANSDUCER ULTRASND (DRAPES) IMPLANT
DECANTER SPIKE VIAL GLASS SM (MISCELLANEOUS) ×1 IMPLANT
DERMABOND ADVANCED (GAUZE/BANDAGES/DRESSINGS) ×1
DERMABOND ADVANCED .7 DNX12 (GAUZE/BANDAGES/DRESSINGS) ×1 IMPLANT
DRAPE C-ARM 42X120 X-RAY (DRAPES) ×1 IMPLANT
ELECT REM PT RETURN 15FT ADLT (MISCELLANEOUS) ×2 IMPLANT
GAUZE SPONGE 2X2 8PLY STRL LF (GAUZE/BANDAGES/DRESSINGS) ×1 IMPLANT
GLOVE SURG ENC MOIS LTX SZ7.5 (GLOVE) ×2 IMPLANT
GOWN STRL REUS W/TWL XL LVL3 (GOWN DISPOSABLE) ×6 IMPLANT
GRASPER SUT TROCAR 14GX15 (MISCELLANEOUS) ×1 IMPLANT
IRRIG SUCT STRYKERFLOW 2 WTIP (MISCELLANEOUS) ×2
IRRIGATION SUCT STRKRFLW 2 WTP (MISCELLANEOUS) ×1 IMPLANT
KIT BASIN OR (CUSTOM PROCEDURE TRAY) ×2 IMPLANT
KIT TURNOVER KIT A (KITS) ×2 IMPLANT
NDL INSUFFLATION 14GA 120MM (NEEDLE) ×1 IMPLANT
NEEDLE INSUFFLATION 14GA 120MM (NEEDLE) ×2 IMPLANT
PENCIL SMOKE EVACUATOR (MISCELLANEOUS) IMPLANT
POUCH RETRIEVAL ECOSAC 10 (ENDOMECHANICALS) IMPLANT
POUCH RETRIEVAL ECOSAC 10MM (ENDOMECHANICALS) ×2
SCISSORS LAP 5X35 DISP (ENDOMECHANICALS) ×2 IMPLANT
SET CHOLANGIOGRAPH MIX (MISCELLANEOUS) ×1 IMPLANT
SET TUBE SMOKE EVAC HIGH FLOW (TUBING) ×2 IMPLANT
SLEEVE XCEL OPT CAN 5 100 (ENDOMECHANICALS) ×3 IMPLANT
SPONGE GAUZE 2X2 STER 10/PKG (GAUZE/BANDAGES/DRESSINGS) ×1
SUT MNCRL AB 4-0 PS2 18 (SUTURE) ×2 IMPLANT
TOWEL OR 17X26 10 PK STRL BLUE (TOWEL DISPOSABLE) ×2 IMPLANT
TRAY LAPAROSCOPIC (CUSTOM PROCEDURE TRAY) ×2 IMPLANT
TROCAR BLADELESS OPT 5 100 (ENDOMECHANICALS) ×2 IMPLANT
TROCAR XCEL NON-BLD 11X100MML (ENDOMECHANICALS) ×2 IMPLANT

## 2021-02-14 NOTE — Anesthesia Postprocedure Evaluation (Signed)
Anesthesia Post Note  Patient: Rose Blair  Procedure(s) Performed: LAPAROSCOPIC CHOLECYSTECTOMY (Abdomen)     Patient location during evaluation: PACU Anesthesia Type: General Level of consciousness: awake and alert Pain management: pain level controlled Vital Signs Assessment: post-procedure vital signs reviewed and stable Respiratory status: spontaneous breathing, nonlabored ventilation, respiratory function stable and patient connected to nasal cannula oxygen Cardiovascular status: blood pressure returned to baseline and stable Postop Assessment: no apparent nausea or vomiting Anesthetic complications: no   No notable events documented.  Last Vitals:  Vitals:   02/14/21 1145 02/14/21 1200  BP: (!) 142/69 (!) 142/68  Pulse: 63 64  Resp: 14 20  Temp:  36.6 C  SpO2: 94% 92%    Last Pain:  Vitals:   02/14/21 1200  TempSrc:   PainSc: 0-No pain                 Liana Camerer S

## 2021-02-14 NOTE — Brief Op Note (Signed)
02/14/2021  10:48 AM  PATIENT:  Rose Blair  60 y.o. female  PRE-OPERATIVE DIAGNOSIS:  BILIARY DYSKINESIA  POST-OPERATIVE DIAGNOSIS:  BILIARY DYSKINESIA  PROCEDURE:  Procedure(s): LAPAROSCOPIC CHOLECYSTECTOMY (N/A)  SURGEON:  Surgeon(s) and Role:    * Axel Filler, MD - Primary  ASSISTANTS: Basilio Cairo, MD   ANESTHESIA:   general  EBL:  15cc  BLOOD ADMINISTERED:none  DRAINS: none   LOCAL MEDICATIONS USED:  10cc 0.25% MARCAINE     SPECIMEN: Gallbladder  DISPOSITION OF SPECIMEN:  PATHOLOGY  COUNTS:  YES  TOURNIQUET:  * No tourniquets in log *  DICTATION: .Note written in EPIC  PLAN OF CARE: Discharge to home after PACU  PATIENT DISPOSITION:  PACU - hemodynamically stable.   Delay start of Pharmacological VTE agent (>24hrs) due to surgical blood loss or risk of bleeding: no

## 2021-02-14 NOTE — Anesthesia Preprocedure Evaluation (Signed)
Anesthesia Evaluation  Patient identified by MRN, date of birth, ID band Patient awake    Reviewed: Allergy & Precautions, NPO status , Patient's Chart, lab work & pertinent test results  Airway Mallampati: II  TM Distance: >3 FB Neck ROM: Full    Dental no notable dental hx.    Pulmonary Current Smoker and Patient abstained from smoking.,    Pulmonary exam normal breath sounds clear to auscultation       Cardiovascular negative cardio ROS Normal cardiovascular exam Rhythm:Regular Rate:Normal     Neuro/Psych negative neurological ROS  negative psych ROS   GI/Hepatic Neg liver ROS, GERD  ,  Endo/Other  negative endocrine ROS  Renal/GU negative Renal ROS  negative genitourinary   Musculoskeletal negative musculoskeletal ROS (+)   Abdominal   Peds negative pediatric ROS (+)  Hematology negative hematology ROS (+)   Anesthesia Other Findings   Reproductive/Obstetrics negative OB ROS                             Anesthesia Physical Anesthesia Plan  ASA: 2  Anesthesia Plan: General   Post-op Pain Management:    Induction: Intravenous  PONV Risk Score and Plan: 2 and Ondansetron, Dexamethasone and Treatment may vary due to age or medical condition  Airway Management Planned: Oral ETT  Additional Equipment:   Intra-op Plan:   Post-operative Plan: Extubation in OR  Informed Consent: I have reviewed the patients History and Physical, chart, labs and discussed the procedure including the risks, benefits and alternatives for the proposed anesthesia with the patient or authorized representative who has indicated his/her understanding and acceptance.     Dental advisory given  Plan Discussed with: CRNA and Surgeon  Anesthesia Plan Comments:         Anesthesia Quick Evaluation

## 2021-02-14 NOTE — Interval H&P Note (Signed)
History and Physical Interval Note:  02/14/2021 9:23 AM  Rose Blair  has presented today for surgery, with the diagnosis of BILIARY DYSKINESIA.  The various methods of treatment have been discussed with the patient and family. After consideration of risks, benefits and other options for treatment, the patient has consented to  Procedure(s): LAPAROSCOPIC CHOLECYSTECTOMY (N/A) as a surgical intervention.  The patient's history has been reviewed, patient examined, no change in status, stable for surgery.  I have reviewed the patient's chart and labs.  Questions were answered to the patient's satisfaction.     Axel Filler

## 2021-02-14 NOTE — Transfer of Care (Signed)
Immediate Anesthesia Transfer of Care Note  Patient: Rose Blair  Procedure(s) Performed: LAPAROSCOPIC CHOLECYSTECTOMY (Abdomen)  Patient Location: PACU  Anesthesia Type:General  Level of Consciousness: awake  Airway & Oxygen Therapy: Patient Spontanous Breathing  Post-op Assessment: Report given to RN and Post -op Vital signs reviewed and stable  Post vital signs: Reviewed and stable  Last Vitals:  Vitals Value Taken Time  BP 157/76 02/14/21 1052  Temp    Pulse 75 02/14/21 1054  Resp 28 02/14/21 1054  SpO2 95 % 02/14/21 1054  Vitals shown include unvalidated device data.  Last Pain:  Vitals:   02/14/21 0838  TempSrc:   PainSc: 0-No pain         Complications: No notable events documented.

## 2021-02-14 NOTE — Discharge Instructions (Signed)
[facility] ______[facility] LAPAROSCOPIC SURGERY: POST OP INSTRUCTIONS Always review your discharge instruction sheet given to you by the facility where your surgery was performed. IF YOU HAVE DISABILITY OR FAMILY LEAVE FORMS, YOU MUST BRING THEM TO THE OFFICE FOR PROCESSING.   DO NOT GIVE THEM TO YOUR DOCTOR.  A prescription for pain medication may be given to you upon discharge.  Take your pain medication as prescribed, if needed.  If narcotic pain medicine is not needed, then you may take acetaminophen (Tylenol) or ibuprofen (Advil) as needed. Take your usually prescribed medications unless otherwise directed. If you need a refill on your pain medication, please contact your pharmacy.  They will contact our office to request authorization. Prescriptions will not be filled after 5pm or on week-ends. You should follow a light diet the first few days after arrival home, such as soup and crackers, etc.  Be sure to include lots of fluids daily. Most patients will experience some swelling and bruising in the area of the incisions.  Ice packs will help.  Swelling and bruising can take several days to resolve.  It is common to experience some constipation if taking pain medication after surgery.  Increasing fluid intake and taking a stool softener (such as Colace) will usually help or prevent this problem from occurring.  A mild laxative (Milk of Magnesia or Miralax) should be taken according to package instructions if there are no bowel movements after 48 hours. Unless discharge instructions indicate otherwise, you may remove your bandages 24-48 hours after surgery, and you may shower at that time.  You may have steri-strips (small skin tapes) in place directly over the incision.  These strips should be left on the skin for 7-10 days.  If your surgeon used skin glue on the incision, you may shower in 24 hours.  The glue will flake off over the next 2-3 weeks.  Any sutures or staples will be  removed at the office during your follow-up visit. ACTIVITIES:  You may resume regular (light) daily activities beginning the next day--such as daily self-care, walking, climbing stairs--gradually increasing activities as tolerated.  You may have sexual intercourse when it is comfortable.  Refrain from any heavy lifting or straining until approved by your doctor. You may drive when you are no longer taking prescription pain medication, you can comfortably wear a seatbelt, and you can safely maneuver your car and apply brakes. RETURN TO WORK:  __________________________________________________________ You should see your doctor in the office for a follow-up appointment approximately 2-3 weeks after your surgery.  Make sure that you call for this appointment within a day or two after you arrive home to insure a convenient appointment time. OTHER INSTRUCTIONS: __________________________________________________________________________________________________________________________ __________________________________________________________________________________________________________________________ WHEN TO CALL YOUR DOCTOR: Fever over 101.0 Inability to urinate Continued bleeding from incision. Increased pain, redness, or drainage from the incision. Increasing abdominal pain  The clinic staff is available to answer your questions during regular business hours.  Please don't hesitate to call and ask to speak to one of the nurses for clinical concerns.  If you have a medical emergency, go to the nearest emergency room or call 911.  A surgeon from [facility] is always on call at the hospital. [address], [address], [place], [place]  [address] ? [address] [address], [place], [place]   [address] [phone] ? [phone] ? FAX [phone] Web site: [email or web address]  

## 2021-02-14 NOTE — Anesthesia Procedure Notes (Signed)
Procedure Name: Intubation Date/Time: 02/14/2021 9:52 AM Performed by: Ezekiel Ina, CRNA Pre-anesthesia Checklist: Patient identified, Emergency Drugs available, Suction available and Patient being monitored Patient Re-evaluated:Patient Re-evaluated prior to induction Oxygen Delivery Method: Circle system utilized Preoxygenation: Pre-oxygenation with 100% oxygen Induction Type: IV induction Ventilation: Mask ventilation without difficulty Laryngoscope Size: Miller and 2 Grade View: Grade II Tube type: Oral Tube size: 7.0 mm Number of attempts: 1 Airway Equipment and Method: Stylet Placement Confirmation: ETT inserted through vocal cords under direct vision, positive ETCO2 and breath sounds checked- equal and bilateral Secured at: 21 cm Tube secured with: Tape Dental Injury: Teeth and Oropharynx as per pre-operative assessment  Comments: Patient has anterior larynx - grade 2b view with mil 2

## 2021-02-14 NOTE — Op Note (Signed)
Findings: Normal Appearing Gallbladder  Indications for procedure: Pt is a 21F with chronic RUQ pain, no evidence of cholelithiasis, although reduced ejection fraction on HIDA c/w diagnosis of biliary dyskinesia.   Details of the procedure: The patient was taken to the operating and placed in the supine position with bilateral SCDs in place. A time out was called and all facts were verified. A pneumoperitoneum was obtained via a Veress needle in the RUQ to a pressure of 15 mmHg. The Veress needle was then removed and replaced with a 21mm trochar. The laparoscope was then introduced, the abdomen was survey and without injury. A 11 mm port was then placed in the umbilical region after infiltrating with local anesthesia under direct visualization. Two additional 57mm ports were placed in the epigastric and right flank under direct visualization.   The gallbladder was identified and retracted, the peritoneum was then sharply dissected from the gallbladder and this dissection was carried down to Calot's triangle. The cystic duct was identified and dissected circumferentially and seen going into the gallbladder 360.  The cystic artery was dissected away from the surrounding tissues. A critical view was obtained.   2 clips were placed proximally one distally and the cystic duct transected. The cystic artery was identified and 2 clips placed proximally and one distally and transected. We then proceeded to remove the gallbladder off the hepatic fossa with Bovie cautery. A retrieval bag was then placed in the abdomen and gallbladder placed in the bag. The hepatic fossa was then reexamined and it was deemed hemostatic without the need for additional cautery. The subhepatic fossa and perihepatic fossa was then irrigated until the effluent was clear. The specimen bag and specimen were removed from the abdominal cavity.  The 11 mm trocar fascia was reapproximated with an 0 Vicryl using a suture passer. The  pneumoperitoneum was evacuated and all trochars removed under direct visulalization. The skin was then closed with 4-0 Monocryl and the skin dressed with Dermabond. The patient was awaken from general anesthesia and taken to the recovery room in stable condition.   Total Local: 10cc 0.25% marcaine with epinephrine  Specimen: Gallbladder  EBL: 10cc   Complications: none   Counts: reported as correct x 2

## 2021-02-15 ENCOUNTER — Encounter (HOSPITAL_COMMUNITY): Payer: Self-pay | Admitting: General Surgery

## 2021-02-15 LAB — SURGICAL PATHOLOGY

## 2021-09-14 ENCOUNTER — Encounter (HOSPITAL_COMMUNITY): Payer: Self-pay

## 2021-09-14 ENCOUNTER — Other Ambulatory Visit: Payer: Self-pay

## 2021-09-14 ENCOUNTER — Emergency Department (HOSPITAL_COMMUNITY): Payer: Commercial Managed Care - PPO

## 2021-09-14 ENCOUNTER — Emergency Department (HOSPITAL_COMMUNITY)
Admission: EM | Admit: 2021-09-14 | Discharge: 2021-09-15 | Disposition: A | Discharge status: Home / Self Care | Payer: Commercial Managed Care - PPO | Attending: Emergency Medicine | Admitting: Emergency Medicine

## 2021-09-14 DIAGNOSIS — R109 Unspecified abdominal pain: Secondary | ICD-10-CM | POA: Diagnosis not present

## 2021-09-14 DIAGNOSIS — M545 Low back pain, unspecified: Secondary | ICD-10-CM | POA: Insufficient documentation

## 2021-09-14 LAB — COMPREHENSIVE METABOLIC PANEL
ALT: 22 U/L (ref 0–44)
AST: 38 U/L (ref 15–41)
Albumin: 3.9 g/dL (ref 3.5–5.0)
Alkaline Phosphatase: 70 U/L (ref 38–126)
Anion gap: 9 (ref 5–15)
BUN: 12 mg/dL (ref 8–23)
CO2: 22 mmol/L (ref 22–32)
Calcium: 8.9 mg/dL (ref 8.9–10.3)
Chloride: 104 mmol/L (ref 98–111)
Creatinine, Ser: 0.53 mg/dL (ref 0.44–1.00)
GFR, Estimated: >60 mL/min (ref >60)
Glucose, Bld: 102 mg/dL — ABNORMAL HIGH (ref 70–99)
Potassium: 3.8 mmol/L (ref 3.5–5.1)
Sodium: 135 mmol/L (ref 135–145)
Total Bilirubin: 0.6 mg/dL (ref 0.3–1.2)
Total Protein: 7 g/dL (ref 6.5–8.1)

## 2021-09-14 LAB — CBC
HCT: 35.6 % — ABNORMAL LOW (ref 36.0–46.0)
Hemoglobin: 12.8 g/dL (ref 12.0–15.0)
MCH: 36 pg — ABNORMAL HIGH (ref 26.0–34.0)
MCHC: 36 g/dL (ref 30.0–36.0)
MCV: 100 fL (ref 80.0–100.0)
Platelets: 285 10*3/uL (ref 150–400)
RBC: 3.56 MIL/uL — ABNORMAL LOW (ref 3.87–5.11)
RDW: 13.4 % (ref 11.5–15.5)
WBC: 8.4 10*3/uL (ref 4.0–10.5)
nRBC: 0 % (ref 0.0–0.2)

## 2021-09-14 LAB — URINALYSIS, ROUTINE W REFLEX MICROSCOPIC
Bilirubin Urine: NEGATIVE
Glucose, UA: NEGATIVE mg/dL
Ketones, ur: NEGATIVE mg/dL
Nitrite: NEGATIVE
Protein, ur: NEGATIVE mg/dL
Specific Gravity, Urine: 1.012 (ref 1.005–1.030)
pH: 5 (ref 5.0–8.0)

## 2021-09-14 LAB — TROPONIN I (HIGH SENSITIVITY): Troponin I (High Sensitivity): 3 ng/L (ref <18)

## 2021-09-14 LAB — LIPASE, BLOOD: Lipase: 29 U/L (ref 11–51)

## 2021-09-14 NOTE — ED Provider Notes (Signed)
?[facility]-EMERGENCY DEPT ?Provider Note ? ? ?CSN: [id number] ?Arrival date & time: 09/14/21  1944 ? ?  ? ?History ? ?Chief Complaint  ?Patient presents with  ? Back Pain  ? Abdominal Pain  ? ? ?Rose Blair is a 61 y.o. female. ? ?61 yo F with a chief complaints of abdominal pain that radiated to her back.  This was sudden and acute.  She felt like it was very severe going from her upper abdomen straight into her back.  She has never had pain like this before.  She does have chronic pain up and down her entire spine.  The pain in the abdomen has resolved and the pain in the upper back is resolved but now she has residual lower back pain.  Does not typically have this back pain but does feel like it could be normal for her.  She denies trauma.  The episode started when she went to urinate.  She denies any urinary symptoms otherwise.  Denies fevers or chills.  Denies one-sided numbness or weakness.  Denies loss of bowel or bladder.  History of cholecystectomy. ? ? ?Back Pain ?Associated symptoms: abdominal pain   ?Abdominal Pain ? ?  ? ?Home Medications ?Prior to Admission medications   ?Medication Sig Start Date End Date Taking? Authorizing Provider  ?acetaminophen (TYLENOL) 500 MG tablet Take 1,000 mg by mouth every 6 (six) hours as needed for moderate pain.    [provider]  ?albuterol (VENTOLIN HFA) 108 (90 Base) MCG/ACT inhaler Inhale 1-2 puffs into the lungs every 6 (six) hours as needed for wheezing or shortness of breath.    [provider]  ?Boswellia-Glucosamine-Vit D (OSTEO BI-FLEX ONE PER DAY) TABS Take 1 tablet by mouth daily.    [provider]  ?Lifitegrast Benay Spice(XIIDRA) 5 % SOLN Place 1 drop into both eyes in the morning and at bedtime.    [provider]  ?Omega-3 Fatty Acids (FISH OIL) 1200 MG CAPS Take 1,200 mg by mouth daily.    [provider]  ?omeprazole (PRILOSEC) 40 MG capsule Take 40 mg by mouth daily.    [provider]  ?traMADol (ULTRAM) 50 MG tablet Take 1 tablet (50 mg total) by mouth every 6 (six) hours as needed. 02/14/21 02/14/22  Axel Filleramirez, Armando, MD  ?   ? ?Allergies    ?Codeine   ? ?Review of Systems   ?Review of Systems  ?Gastrointestinal:  Positive for abdominal pain.  ?Musculoskeletal:  Positive for back pain.  ? ?Physical Exam ?Updated Vital Signs ?BP (!) 141/90   Pulse 71   Temp 98.2 ?F (36.8 ?C) (Oral)   Resp 18   Ht 5\' 10"  (1.778 m)   Wt 75.3 kg   SpO2 96%   BMI 23.82 kg/m?  ?Physical Exam ?Vitals and nursing note reviewed.  ?Constitutional:   ?   General: She is not in acute distress. ?   Appearance: She is well-developed. She is not diaphoretic.  ?HENT:  ?   Head: Normocephalic and atraumatic.  ?Eyes:  ?   Pupils: Pupils are equal, round, and reactive to light.  ?Cardiovascular:  ?   Rate and Rhythm: Normal rate and regular rhythm.  ?   Heart sounds: No murmur heard. ?  No friction rub. No gallop.  ?Pulmonary:  ?   Effort: Pulmonary effort is normal.  ?   Breath sounds: No wheezing or rales.  ?Abdominal:  ?   General: There is no distension.  ?  Palpations: Abdomen is soft.  ?   Tenderness: There is no abdominal tenderness.  ?Musculoskeletal:     ?   General: No tenderness.  ?   Cervical back: Normal range of motion and neck supple.  ?   Comments: Intact distal pulses.  She points to her midline low back as area of pain.  No tenderness step-offs or deformities.  ?Skin: ?   General: Skin is warm and dry.  ?Neurological:  ?   Mental Status: She is alert and oriented to person, place, and time.  ?Psychiatric:     ?   Behavior: Behavior normal.  ? ? ?ED Results / Procedures / Treatments   ?Labs ?(all labs ordered are listed, but only abnormal results are displayed) ?Labs Reviewed  ?CBC - Abnormal; Notable for the following components:  ?    Result Value  ? RBC 3.56 (*)   ? HCT 35.6 (*)   ? MCH 36.0 (*)   ? All other components within normal limits  ?COMPREHENSIVE METABOLIC PANEL - Abnormal; Notable for the  following components:  ? Glucose, Bld 102 (*)   ? All other components within normal limits  ?URINALYSIS, ROUTINE W REFLEX MICROSCOPIC - Abnormal; Notable for the following components:  ? APPearance HAZY (*)   ? Hgb urine dipstick MODERATE (*)   ? Leukocytes,Ua TRACE (*)   ? Bacteria, UA MANY (*)   ? All other components within normal limits  ?LIPASE, BLOOD  ?TROPONIN I (HIGH SENSITIVITY)  ?TROPONIN I (HIGH SENSITIVITY)  ? ? ?EKG ?EKG Interpretation ? ?Date/Time:  Friday Sep 14 2021 20:19:01 EDT ?Ventricular Rate:  80 ?PR Interval:  173 ?QRS Duration: 94 ?QT Interval:  361 ?QTC Calculation: 417 ?R Axis:   63 ?Text Interpretation: Sinus rhythm Atrial premature complex RSR' in V1 or V2, right VCD or RVH No significant change since last tracing Confirmed by Melene Plan [phone]) on 09/14/2021 11:05:34 PM ? ?Radiology ?DG Chest Port 1 View ? ?Result Date: 09/14/2021 ?CLINICAL DATA:  Chest pain and shortness of breath for 1 hour EXAM: PORTABLE CHEST 1 VIEW COMPARISON:  08/11/2000 FINDINGS: Cardiac shadow is within normal limits. Mild aortic calcifications are seen. Lungs are clear bilaterally. Postsurgical changes in the cervical spine are noted. No acute bony abnormality is seen. IMPRESSION: No acute abnormality noted. Electronically Signed   By: Alcide Clever M.D.   On: 09/14/2021 21:06  ? ?CT Angio Chest/Abd/Pel for Dissection W and/or Wo Contrast ? ?Result Date: 09/15/2021 ?CLINICAL DATA:  acute CP radiating to back EXAM: CT ANGIOGRAPHY CHEST, ABDOMEN AND PELVIS TECHNIQUE: Non-contrast CT of the chest was initially obtained. Multidetector CT imaging through the chest, abdomen and pelvis was performed using the standard protocol during bolus administration of intravenous contrast. Multiplanar reconstructed images and MIPs were obtained and reviewed to evaluate the vascular anatomy. RADIATION DOSE REDUCTION: This exam was performed according to the departmental dose-optimization program which includes automated exposure  control, adjustment of the mA and/or kV according to patient size and/or use of iterative reconstruction technique. CONTRAST:  OMNIPAQUE IOHEXOL 350 MG/ML SOLN COMPARISON:  None Available. FINDINGS: CTA CHEST FINDINGS Cardiovascular: Heart is normal size. Aorta is normal caliber. Aortic atherosclerosis. No dissection. No filling defects in the pulmonary arteries to suggest pulmonary emboli. Mediastinum/Nodes: No mediastinal, hilar, or axillary adenopathy. Trachea and esophagus are unremarkable. Thyroid unremarkable. Lungs/Pleura: Mild centrilobular emphysema. Biapical scarring. No confluent airspace opacities or effusions. Musculoskeletal: Chest wall soft tissues are unremarkable. No acute bony abnormality. Review of the MIP  images confirms the above findings. CTA ABDOMEN AND PELVIS FINDINGS VASCULAR Aorta: Normal caliber aorta without aneurysm, dissection, vasculitis or significant stenosis. Aortic atherosclerosis. Celiac: Patent without evidence of aneurysm, dissection, vasculitis or significant stenosis. SMA: Patent without evidence of aneurysm, dissection, vasculitis or significant stenosis. Renals: Both renal arteries are patent without evidence of aneurysm, dissection, vasculitis, fibromuscular dysplasia or significant stenosis. IMA: Patent without evidence of aneurysm, dissection, vasculitis or significant stenosis. Inflow: Patent without evidence of aneurysm, dissection, vasculitis or significant stenosis. Veins: No obvious venous abnormality within the limitations of this arterial phase study. Review of the MIP images confirms the above findings. NON-VASCULAR Hepatobiliary: No focal hepatic abnormality.  Prior cholecystectomy Pancreas: No focal abnormality or ductal dilatation. Spleen: No focal abnormality.  Normal size. Adrenals/Urinary Tract: No adrenal abnormality. No focal renal abnormality. No stones or hydronephrosis. Urinary bladder is unremarkable. Stomach/Bowel: Stomach, large and small bowel  grossly unremarkable. Lymphatic: No adenopathy Reproductive: Uterus and adnexa unremarkable.  No mass. Other: No free fluid or free air. Musculoskeletal: No acute bony abnormality. Review of the MIP images confi

## 2021-09-14 NOTE — ED Triage Notes (Signed)
Pt BIB EMS. Pt complains of back pain x 1 week. Pt states that the abdominal pain started last night.  ?

## 2021-09-14 NOTE — ED Provider Triage Note (Signed)
Emergency Medicine Provider Triage Evaluation Note ? ?Rose Blair , a 61 y.o. female  was evaluated in triage.  Pt complains of chest pain, abdominal pain, shortness of breath that began to an hour ago when she was drinking wine. Pain is described as sharp and stabbing, radiating to her back. Patient is in obvious discomfort.  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 She denies fever, chills, night sweats, vomiting, urinary/vaginal symptoms, change in bowel habits. ? ?Review of Systems  ?Positive:  ?Negative: See above ? ?Physical Exam  ?BP (!) 132/95 (BP Location: Left Arm)   Pulse 86   Resp 20   Ht 5\' 10"  (1.778 m)   Wt 75.3 kg   SpO2 97%   BMI 23.82 kg/m?  ?Gen:   Awake, in acute distress, not finding comfortable position in chair ?Resp:  Respiratory effort increased. She is tachypnic ?MSK:   Moves extremities without difficulty  ?Other:  Abdominal exam is largely unremarkable. She describes some epigastric discomfort but not very appreciable on exam. No overlying skin abnormaities. No CVA tenderness.  ? ?Medical Decision Making  ?Medically screening exam initiated at 8:04 PM.  Appropriate orders placed.  Rose Blair was informed that the remainder of the evaluation will be completed by another provider, this initial triage assessment does not replace that evaluation, and the importance of remaining in the ED until their evaluation is complete. ? ? ?  ?Rose Blair, Rose Blair ?09/14/21 2019 ? ?

## 2021-09-15 ENCOUNTER — Emergency Department (HOSPITAL_COMMUNITY): Payer: Commercial Managed Care - PPO

## 2021-09-15 LAB — TROPONIN I (HIGH SENSITIVITY): Troponin I (High Sensitivity): 3 ng/L (ref <18)

## 2021-09-15 MED ORDER — IOHEXOL 350 MG/ML SOLN
100.0000 mL | Freq: Once | INTRAVENOUS | Status: AC | PRN
  Administered 2021-09-15: 100 mL via INTRAVENOUS

## 2021-09-15 NOTE — Discharge Instructions (Signed)
Your CT scan did not show any acute abnormality.  Please follow-up with your family doctor in the office. ? ?Your back pain is most likely due to a muscular strain.  There is been a lot of research on back pain, unfortunately the only thing that seems to really help is Tylenol and ibuprofen.  Relative rest is also important to not lift greater than 10 pounds bending or twisting at the waist.  Please follow-up with your family physician.  The other thing that really seems to benefit patients is physical therapy which your doctor may send you for.  Please return to the emergency department for new numbness or weakness to your arms or legs. Difficulty with urinating or urinating or pooping on yourself.  Also if you cannot feel toilet paper when you wipe or get a fever.  ? ?Take 4 over the counter ibuprofen tablets 3 times a day or 2 over-the-counter naproxen tablets twice a day for pain. ?Also take tylenol 1000mg (2 extra strength) four times a day.  ? ?

## 2022-10-30 ENCOUNTER — Other Ambulatory Visit: Payer: Self-pay | Admitting: Obstetrics and Gynecology

## 2022-10-30 ENCOUNTER — Other Ambulatory Visit: Payer: Self-pay

## 2022-10-30 DIAGNOSIS — Z720 Tobacco use: Secondary | ICD-10-CM

## 2022-11-22 ENCOUNTER — Ambulatory Visit
Admission: RE | Admit: 2022-11-22 | Discharge: 2022-11-22 | Disposition: A | Discharge status: Home / Self Care | Payer: Commercial Managed Care - PPO | Source: Ambulatory Visit | Attending: Obstetrics and Gynecology | Admitting: Obstetrics and Gynecology

## 2022-11-22 DIAGNOSIS — Z720 Tobacco use: Secondary | ICD-10-CM

## 2022-12-22 ENCOUNTER — Encounter: Payer: Self-pay | Admitting: Cardiovascular Disease

## 2022-12-22 NOTE — Progress Notes (Signed)
  Cardiology Office Note:  .   Date:  12/22/2022  ID:  Rose Blair, DOB [birth year], MRN [id number] PCP: Iona Hansen, NP  [facility] Providers Cardiologist:  new to [facility]  Click to update primary MD,subspecialty MD or APP then REFRESH:1}   History of Present Illness: .   Rose Blair is a 62 y.o. female with hx of cigarette smoking  She had her cancer screening CT scan on November 22, 2022 Which showd atherosclerosis of her aorta, great vessels and her coronary arteries ( LAD )   She is here to discuss further work up     ROS: ***  Studies Reviewed: .        *** Risk Assessment/Calculations:   {Does this patient have ATRIAL FIBRILLATION?:[phone]} No BP recorded.  {Refresh Note OR Click here to enter BP  :1}***       Physical Exam:   VS:  There were no vitals taken for this visit.   Wt Readings from Last 3 Encounters:  09/14/21 166 lb (75.3 kg)  02/14/21 166 lb (75.3 kg)  02/13/21 166 lb (75.3 kg)    GEN: Well nourished, well developed in no acute distress NECK: No JVD; No carotid bruits CARDIAC: ***RRR, no murmurs, rubs, gallops RESPIRATORY:  Clear to auscultation without rales, wheezing or rhonchi  ABDOMEN: Soft, non-tender, non-distended EXTREMITIES:  No edema; No deformity   ASSESSMENT AND PLAN: .   ***    {Are you ordering a CV Procedure (e.g. stress test, cath, DCCV, TEE, etc)?   Press F2        :[id number]}  Dispo: ***  Signed, Kristeen Miss, MD

## 2022-12-23 ENCOUNTER — Encounter: Payer: Commercial Managed Care - PPO | Admitting: Cardiovascular Disease

## 2022-12-27 ENCOUNTER — Encounter: Payer: Self-pay | Admitting: Cardiovascular Disease

## 2022-12-27 ENCOUNTER — Ambulatory Visit: Payer: Commercial Managed Care - PPO | Attending: Internal Medicine | Admitting: Cardiovascular Disease

## 2022-12-27 VITALS — BP 120/82 | HR 75 | Ht 70.0 in | Wt 146.2 lb

## 2022-12-27 DIAGNOSIS — I700 Atherosclerosis of aorta: Secondary | ICD-10-CM | POA: Diagnosis not present

## 2022-12-27 DIAGNOSIS — I2584 Coronary atherosclerosis due to calcified coronary lesion: Secondary | ICD-10-CM

## 2022-12-27 DIAGNOSIS — I2510 Atherosclerotic heart disease of native coronary artery without angina pectoris: Secondary | ICD-10-CM

## 2022-12-27 MED ORDER — ATORVASTATIN CALCIUM 40 MG PO TABS: 40.0000 mg | ORAL_TABLET | Freq: Every day | ORAL | 3 refills | Status: DC

## 2022-12-27 NOTE — Patient Instructions (Signed)
Medication Instructions:  Your physician has recommended you make the following change in your medication:   1) START atorvastatin (Lipitor) 40mg  daily  *If you need a refill on your cardiac medications before your next appointment, please call your pharmacy*  Lab Work: In 3 months: fasting Lipid panel, ALT If you have labs (blood work) drawn today and your tests are completely normal, you will receive your results only by: MyChart Message (if you have MyChart) OR A paper copy in the mail If you have any lab test that is abnormal or we need to change your treatment, we will call you to review the results.  Testing/Procedures: None ordered today.  Follow-Up: At [facility], you and your health needs are our priority.  As part of our continuing mission to provide you with exceptional heart care, we have created designated Provider Care Teams.  These Care Teams include your primary Cardiologist (physician) and Advanced Practice Providers (APPs -  Physician Assistants and Nurse Practitioners) who all work together to provide you with the care you need, when you need it.  Your next appointment:   12 month(s)  The format for your next appointment:   In Person  Provider:   Kristeen Miss, MD {

## 2022-12-27 NOTE — Progress Notes (Signed)
  Cardiology Office Note:  .   Date:  12/27/2022  ID:  Rose Blair, DOB 12-15-1960, MRN [id number] PCP: Iona Hansen, NP  [facility] Providers Cardiologist:  Jeyli Zwicker  Click to update primary MD,subspecialty MD or APP then REFRESH:1}   History of Present Illness: .   Rose Blair is a 62 y.o. female with coronary artery calcifications that were was incidentally noted on a cancer screening CT scan.  Has chronic dyspnea  No CP   No regular exercise  Started walking with her husband but is having to stop because of her dyspnea   Smokes 1 ppd, Now is down to 3/4 - 1/2 ppd   Labs from her primary medical doctor in the [facility] showed total cholesterol of 176 Triglyceride levels 118 HDL is 71 LDL is 81 She is currently not on any cholesterol meds       ROS:   Studies Reviewed: Marland Kitchen       EKG Interpretation Date/Time:  Friday December 27 2022 11:57:20 EDT Ventricular Rate:  75 PR Interval:  182 QRS Duration:  96 QT Interval:  378 QTC Calculation: 422 R Axis:   84  Text Interpretation: Normal sinus rhythm Right atrial enlargement When compared with ECG of 14-Sep-2021 20:19, No significant change since last tracing Confirmed by Kristeen Miss ([id number]) on 12/27/2022 12:25:16 PM    Risk Assessment/Calculations:             Physical Exam:   VS:  BP 120/82 (BP Location: Left Arm, Patient Position: Sitting, Cuff Size: Normal)   Pulse 75   Ht 5\' 10"  (1.778 m)   Wt 146 lb 3.2 oz (66.3 kg)   SpO2 98%   BMI 20.98 kg/m    Wt Readings from Last 3 Encounters:  12/27/22 146 lb 3.2 oz (66.3 kg)  09/14/21 166 lb (75.3 kg)  02/14/21 166 lb (75.3 kg)    GEN: Well nourished, well developed in no acute distress NECK: No JVD; No carotid bruits CARDIAC: RRR, no murmurs, rubs, gallops RESPIRATORY:  Clear to auscultation without rales, wheezing or rhonchi  ABDOMEN: Soft, non-tender, non-distended EXTREMITIES:  No edema; No deformity   ASSESSMENT AND PLAN: .    Coronary artery calcifications: [name] presents with coronary calcifications that were found incidentally on a cancer screening CT scan.  She has chronic shortness of breath due to COPD but has not had any episodes of chest pains or tightness.  Her last LDL was 81.  Her goal LDL is less than 70.  Will start her on his atorvastatin 40 mg a day.  Will check lipids and ALT in 3 months.  Will plan to see her again in 1 year.  If she gets to goal then we can probably see her on an as-needed basis.        Dispo: 1 year    Signed, Kristeen Miss, MD

## 2023-02-05 ENCOUNTER — Ambulatory Visit: Payer: Commercial Managed Care - PPO | Admitting: Internal Medicine

## 2023-03-12 ENCOUNTER — Institutional Professional Consult (permissible substitution): Payer: Commercial Managed Care - PPO | Admitting: Pulmonary Disease

## 2023-03-18 ENCOUNTER — Ambulatory Visit: Payer: Commercial Managed Care - PPO | Admitting: Pulmonary Disease

## 2023-03-18 ENCOUNTER — Encounter: Payer: Self-pay | Admitting: Pulmonary Disease

## 2023-03-18 ENCOUNTER — Institutional Professional Consult (permissible substitution): Payer: Commercial Managed Care - PPO | Admitting: Pulmonary Disease

## 2023-03-18 VITALS — BP 112/66 | HR 83 | Temp 98.0°F | Ht 69.5 in | Wt 145.0 lb

## 2023-03-18 DIAGNOSIS — J4540 Moderate persistent asthma, uncomplicated: Secondary | ICD-10-CM

## 2023-03-18 DIAGNOSIS — F17210 Nicotine dependence, cigarettes, uncomplicated: Secondary | ICD-10-CM | POA: Diagnosis not present

## 2023-03-18 DIAGNOSIS — R0609 Other forms of dyspnea: Secondary | ICD-10-CM

## 2023-03-18 LAB — PULMONARY FUNCTION TEST
DL/VA % pred: 68 %
DL/VA: 2.76 ml/min/mmHg/L
DLCO cor % pred: 51 %
DLCO cor: 12.48 ml/min/mmHg
DLCO unc % pred: 53 %
DLCO unc: 12.77 ml/min/mmHg
FEF 25-75 Post: 0.93 L/s
FEF 25-75 Pre: 0.72 L/s
FEF2575-%Change-Post: 28 %
FEF2575-%Pred-Post: 36 %
FEF2575-%Pred-Pre: 27 %
FEV1-%Change-Post: 9 %
FEV1-%Pred-Post: 55 %
FEV1-%Pred-Pre: 50 %
FEV1-Post: 1.68 L
FEV1-Pre: 1.54 L
FEV1FVC-%Change-Post: -1 %
FEV1FVC-%Pred-Pre: 71 %
FEV6-%Change-Post: 10 %
FEV6-%Pred-Post: 80 %
FEV6-%Pred-Pre: 72 %
FEV6-Post: 3.07 L
FEV6-Pre: 2.78 L
FEV6FVC-%Change-Post: 0 %
FEV6FVC-%Pred-Post: 102 %
FEV6FVC-%Pred-Pre: 103 %
FVC-%Change-Post: 11 %
FVC-%Pred-Post: 78 %
FVC-%Pred-Pre: 70 %
FVC-Post: 3.09 L
FVC-Pre: 2.78 L
Post FEV1/FVC ratio: 54 %
Post FEV6/FVC ratio: 99 %
Pre FEV1/FVC ratio: 55 %
Pre FEV6/FVC Ratio: 100 %
RV % pred: 132 %
RV: 3.06 L
TLC % pred: 102 %
TLC: 6.01 L

## 2023-03-18 MED ORDER — TRELEGY ELLIPTA 200-62.5-25 MCG/ACT IN AEPB
1.0000 | INHALATION_SPRAY | Freq: Every day | RESPIRATORY_TRACT | Status: DC
Start: 2023-03-18 — End: 2023-05-21

## 2023-03-18 MED ORDER — TRELEGY ELLIPTA 200-62.5-25 MCG/ACT IN AEPB: 1.0000 | INHALATION_SPRAY | Freq: Every day | RESPIRATORY_TRACT | 3 refills | Status: DC

## 2023-03-18 NOTE — Progress Notes (Signed)
@Patient  ID: Ether Griffins, female    DOB: 1961/03/24, 62 y.o.   MRN: [id number]  Chief Complaint  Patient presents with   Consult    SOB with exertion x 6 months.  Some wheeze and cough.  Patient is everyday smoker.    Referring provider: Iona Hansen, NP  HPI:   62 y.o. woman whom we are seeing for evaluation of dyspnea on exertion.  Most recent cardiology note reviewed.  Note from referring provider reviewed.  Patient notes dyspnea exertion for the last 6 months or so.  Typically with long walks.  Maybe a bit worse on inclines or stairs.  Short stairs like getting it out not a big deal.  She reports feeling a bit panicky at this time.  She is unclear if this is related to true dyspnea or just panic attacks.  But notes that with long walks she gets very short of breath and needs to stop to recover her breathing.  No time of day when things are better or worse.  She does think with change of seasons, spring and fall the symptoms seem a bit worse over the last few months.  No position to make things better or worse.  Albuterol helps sometimes but not always.  No other alleviating or exacerbating factors.  As part of her workup she had an EKG that the automatic read reports possible right atrial enlargement.  She had a CT scan lung cancer screening 11/2022 that demonstrated significant emphysematous changes in the upper lobes, more mild in the right middle lobe and lower lobes, biapical scarring, without significant finding otherwise.  Reports coronary artery calcifications.  Seen by cardiology.  Per her report they would not think the dyspnea is related to her heart.  No additional testing was recommended or ordered.  She is a current smoker, currently reducing smoking less than 5 cigarettes a day.  At 1 point he quit for 6 months.  On average 1 pack a day for the last 44 years.  At height was smoking 1.5 packs/day.  Questionaires / Pulmonary Flowsheets:   ACT:      No data to display           MMRC:     No data to display          Epworth:      No data to display          Tests:   FENO:  No results found for: "NITRICOXIDE"  PFT:     No data to display          WALK:      No data to display          Imaging: Personally reviewed and as per EMR and discussion in this note No results found.  Lab Results: Personally reviewed CBC    Component Value Date/Time   WBC 8.4 09/14/2021 2029   RBC 3.56 (L) 09/14/2021 2029   HGB 12.8 09/14/2021 2029   HCT 35.6 (L) 09/14/2021 2029   PLT 285 09/14/2021 2029   MCV 100.0 09/14/2021 2029   MCH 36.0 (H) 09/14/2021 2029   MCHC 36.0 09/14/2021 2029   RDW 13.4 09/14/2021 2029    BMET    Component Value Date/Time   NA 135 09/14/2021 2029   K 3.8 09/14/2021 2029   CL 104 09/14/2021 2029   CO2 22 09/14/2021 2029   GLUCOSE 102 (H) 09/14/2021 2029   BUN 12 09/14/2021 2029   CREATININE 0.53  09/14/2021 2029   CALCIUM 8.9 09/14/2021 2029   GFRNONAA >60 09/14/2021 2029   GFRAA >60 02/01/2016 1340    BNP No results found for: "BNP"  ProBNP No results found for: "PROBNP"  Specialty Problems   None   Allergies  Allergen Reactions   Codeine Nausea And Vomiting    Immunization History  Administered Date(s) Administered   Moderna Covid-19 Fall Seasonal Vaccine 38yrs & older 02/28/2022, 03/03/2023   PFIZER(Purple Top)SARS-COV-2 Vaccination 07/14/2019, 08/04/2019, 03/18/2020   Pfizer Covid-19 Vaccine Bivalent Booster 53yrs & up 03/17/2021    Past Medical History:  Diagnosis Date   Arthritis    GERD (gastroesophageal reflux disease)    History of bronchitis    Pneumonia     Tobacco History: Social History   Tobacco Use  Smoking Status Every Day   Current packs/day: 1.00   Average packs/day: 1 pack/day for 48.0 years (48.0 ttl pk-yrs)   Types: Cigarettes  Smokeless Tobacco Never  Tobacco Comments   Smokes about 5 cigarettes a day.  Updated 03/18/2023.  am   Ready to  quit: Not Answered Counseling given: Not Answered Tobacco comments: Smokes about 5 cigarettes a day.  Updated 03/18/2023.  am   Continue to not smoke  Outpatient Encounter Medications as of 03/18/2023  Medication Sig   acetaminophen (TYLENOL) 500 MG tablet Take 1,000 mg by mouth every 6 (six) hours as needed for moderate pain.   albuterol (VENTOLIN HFA) 108 (90 Base) MCG/ACT inhaler Inhale 1-2 puffs into the lungs every 6 (six) hours as needed for wheezing or shortness of breath.   atorvastatin (LIPITOR) 40 MG tablet Take 1 tablet (40 mg total) by mouth daily.   Boswellia-Glucosamine-Vit D (OSTEO BI-FLEX ONE PER DAY) TABS Take 1 tablet by mouth daily.   Fluticasone-Umeclidin-Vilant (TRELEGY ELLIPTA) 200-62.5-25 MCG/ACT AEPB Inhale 1 puff into the lungs daily.   Lifitegrast (XIIDRA) 5 % SOLN Place 1 drop into both eyes in the morning and at bedtime.   omeprazole (PRILOSEC) 40 MG capsule Take 40 mg by mouth daily.   [DISCONTINUED] Omega-3 Fatty Acids (FISH OIL) 1200 MG CAPS Take 1,200 mg by mouth daily. (Patient not taking: Reported on 03/18/2023)   No facility-administered encounter medications on file as of 03/18/2023.     Review of Systems  Review of Systems  No chest pain with exertion.  No orthopnea or PND.  Comprehensive review of systems otherwise negative. Physical Exam  BP 112/66 (BP Location: Left Arm, Patient Position: Sitting, Cuff Size: Normal)   Pulse 83   Temp 98 F (36.7 C) (Oral)   Ht 5' 9.5" (1.765 m)   Wt 145 lb (65.8 kg)   SpO2 98%   BMI 21.11 kg/m   Wt Readings from Last 5 Encounters:  03/18/23 145 lb (65.8 kg)  12/27/22 146 lb 3.2 oz (66.3 kg)  09/14/21 166 lb (75.3 kg)  02/14/21 166 lb (75.3 kg)  02/13/21 166 lb (75.3 kg)    BMI Readings from Last 5 Encounters:  03/18/23 21.11 kg/m  12/27/22 20.98 kg/m  09/14/21 23.82 kg/m  02/14/21 23.82 kg/m  02/13/21 23.82 kg/m     Physical Exam General: Sitting in chair, in no acute distress Eyes:  EOMI, no icterus Neck: Supple, no JVP Pulmonary: Distant, clear, normal work of breathing Cardiovascular: Warm, no edema Abdomen: Nondistended, bowel sounds present MSK: No synovitis, no joint effusion Neuro: Normal gait, no weakness Psych: Normal mood, full affect  Assessment & Plan:   Emphysema: Related to history of cigarette smoking,  48-pack-year.  Likely contributing to shortness of breath.  Dyspnea on exertion: Suspect multifactorial related to emphysema, deconditioning, underlying asthma.  No significant cardiac testing in the past, EKG hints at right atrial enlargement.  No additional workup ordered via cardiology.  PFTs for further evaluation.  If unrevealing would need additional cardiac workup.  Asthma: Suspect contributing to dyspnea.  Poorly controlled.  Other inhalers have not improved her symptoms.  New prescription Trelegy high-dose 1 puff daily.  Continue albuterol as needed, consider using 10 to 15 minutes prior to exercise, discussed in detail today.  Tobacco abuse: Encouraged yearly screening CT scans, most recent 11/2022. Smoking assessment and cessation counseling I have advised the patient to quit/stop smoking as soon as possible due to high risk for multiple medical problems.  It will also be very difficult for [place] to manage patient's  respiratory symptoms and status if we continue to expose her lungs to a known irritant.  Patient is willing to quit smoking. I have advised the patient that we can assist and have options of nicotine replacement therapy, provided smoking cessation education today, provided smoking cessation counseling, and provided cessation resources. Follow-up next office visit office visit for assessment of smoking cessation.  I spent 3 minutes in smoking cessation counseling.   Return in about 3 months (around 06/18/2023) for f/u Dr. Judeth Horn.   Karren Burly, MD 03/18/2023   This appointment required 64 minutes of patient care (this  includes precharting, chart review, review of results, face-to-face care, etc.).

## 2023-03-18 NOTE — Patient Instructions (Signed)
Full PFT performed today. °

## 2023-03-18 NOTE — Patient Instructions (Signed)
It is nice to meet you  Use Trelegy 1 puff once a day, rinse your mouth out thoroughly with water after every use  See if this helps with your shortness of breath  To get a better idea of your lung function and arrive with a proper diagnosis, I recommend pulmonary function test.  Please ask the front desk to schedule these before the end of the new year if able  Please send me a message if new inhaler, Trelegy, is too expensive  Continue use albuterol as needed, consider using 10 to 15 minutes before exercise to see if it helps you breathe better during exercise  Return to clinic in 3 months or sooner as needed with Dr. Judeth Horn

## 2023-03-18 NOTE — Progress Notes (Signed)
Full PFT performed today. °

## 2023-03-19 ENCOUNTER — Encounter: Payer: Self-pay | Admitting: Pulmonary Disease

## 2023-03-28 ENCOUNTER — Ambulatory Visit: Payer: Commercial Managed Care - PPO | Attending: Cardiovascular Disease

## 2023-03-28 ENCOUNTER — Other Ambulatory Visit: Payer: Self-pay | Admitting: Cardiovascular Disease

## 2023-03-28 DIAGNOSIS — I700 Atherosclerosis of aorta: Secondary | ICD-10-CM

## 2023-03-29 LAB — ALT: ALT: 16 [IU]/L (ref 0–32)

## 2023-03-29 LAB — LIPID PANEL
Chol/HDL Ratio: 1.7 ratio (ref 0.0–4.4)
Cholesterol, Total: 131 mg/dL (ref 100–199)
HDL: 79 mg/dL (ref >39)
LDL Chol Calc (NIH): 40 mg/dL (ref 0–99)
Triglycerides: 54 mg/dL (ref 0–149)
VLDL Cholesterol Cal: 12 mg/dL (ref 5–40)

## 2023-05-21 ENCOUNTER — Encounter: Payer: Self-pay | Admitting: Emergency Medicine

## 2023-05-21 ENCOUNTER — Ambulatory Visit: Payer: Commercial Managed Care - PPO | Admitting: Emergency Medicine

## 2023-05-21 VITALS — BP 104/64 | HR 93 | Temp 97.9°F | Ht 69.5 in | Wt 144.6 lb

## 2023-05-21 DIAGNOSIS — Z23 Encounter for immunization: Secondary | ICD-10-CM | POA: Diagnosis not present

## 2023-05-21 DIAGNOSIS — Z87891 Personal history of nicotine dependence: Secondary | ICD-10-CM | POA: Diagnosis not present

## 2023-05-21 DIAGNOSIS — J449 Chronic obstructive pulmonary disease, unspecified: Secondary | ICD-10-CM

## 2023-05-21 NOTE — Assessment & Plan Note (Signed)
 Severe obstruction documented on her PFT from 03/18/2023.  She has benefited from the Trelegy.  Some increased congestion but no bronchospasm, no evidence of an acute exacerbation.  Plan to continue her Trelegy.  Discussed smoking cessation with her.  Flu shot is up-to-date.  Will give her the Prevnar 20 today.  She will follow-up with Dr. Marygrace Snellen.

## 2023-05-21 NOTE — Patient Instructions (Signed)
 We reviewed your pulmonary function testing today Please continue your Trelegy 1 inhalation once daily.  Rinse and gargle after using. Keep albuterol available to use 2 puffs up to every 4 hours if needed for shortness of breath, chest tightness, wheezing.  Flu shot up-to-date We will give you the Prevnar 20 pneumonia shot today You are due for your repeat lung cancer screening CT chest in July 2025 Follow Dr. Marygrace Snellen in 6 months, sooner if you have any problems.

## 2023-05-21 NOTE — Assessment & Plan Note (Signed)
 Still smoking.  She is motivated to cut down.  Encouraged her to work on this.  Her lung cancer screening CT chest was July 2024.  Next 1 to be done in July 2025.

## 2023-05-21 NOTE — Progress Notes (Signed)
 Subjective:    Patient ID: Rose Blair, female    DOB: 08/09/1960, 63 y.o.   MRN: [id number]  HPI 63 year old active smoker (48 pack years) with a history of GERD, COPD.  She has been seen by Dr. Marygrace Snellen in November.  She was started on Trelegy. She believes that the Trelegy has helped her breathing, less exertional SOB. She is having congestion for a few weeks, since the cold weather. A lot of yellow nasal and sinus drainage - no real change w flonase. No fever. She is working, is an active person. She averages an episode of bronchitis every winter. She is using albuterol much less then before the Trelegy. She is smoking about 7 cig a day.   Pulmonary function testing done 03/18/2023 reviewed by me, showed severe obstruction with a borderline bronchodilator response, normal lung volumes, decreased diffusion capacity.  LDCT 11/22/2022 >> RADS 2S   Review of Systems As per HPI  Past Medical History:  Diagnosis Date   Arthritis    GERD (gastroesophageal reflux disease)    History of bronchitis    Pneumonia      Family History  Problem Relation Age of Onset   Lymphoma Mother    Cancer Brother      Social History   Socioeconomic History   Marital status: Married    Spouse name: Not on file   Number of children: Not on file   Years of education: Not on file   Highest education level: Not on file  Occupational History   Not on file  Tobacco Use   Smoking status: Every Day    Current packs/day: 1.00    Average packs/day: 1 pack/day for 48.0 years (48.0 ttl pk-yrs)    Types: Cigarettes   Smokeless tobacco: Never   Tobacco comments:    Smokes about 8 cigarettes a day.  Updated 05/21/2023 hfb  Vaping Use   Vaping status: Never Used  Substance and Sexual Activity   Alcohol use: Yes    Comment: occasionally    Drug use: No   Sexual activity: Not on file  Other Topics Concern   Not on file  Social History Narrative   Not on file   Social Drivers of Health    Financial Resource Strain: Medium Risk (02/22/2022)   Received from [facility] visits prior to 07/06/2022., [facility] prior to 07/06/2022[facility]   Overall Financial Resource Strain (CARDIA)    Difficulty of Paying Living Expenses: Somewhat hard  Food Insecurity: Low Risk  (02/24/2023)   [facility]   Hunger Vital Sign    Worried About Running Out of Food in the Last Year: Never true    Ran Out of Food in the Last Year: Never true  Transportation Needs: No Transportation Needs (02/24/2023)   Received from [facility]    In the past 12 months, has lack of reliable transportation kept you from medical appointments, meetings, work or from getting things needed for daily living? : No  Physical Activity: Insufficiently Active (02/22/2022)   Received from [facility] visits prior to 07/06/2022., [facility] visits prior to 07/06/2022., [facility]   Exercise Vital Sign    Days of Exercise per Week: 2 days    Minutes of Exercise per Session: 60 min  Stress: Stress Concern Present (02/22/2022)   [facility] visits prior to 07/06/2022.,  [facility] visits prior to 07/06/2022., [facility]   [employer] of Occupational Health - Occupational Stress Questionnaire    Feeling of Stress : To some extent  Social Connections: Moderately Integrated (02/22/2022)   Received from [facility] visits prior to 07/06/2022., [facility] visits prior to 07/06/2022., [facility]   Social Connection and Isolation Panel [NHANES]    Frequency of Communication with Friends and Family: More than three times a week    Frequency of Social Gatherings with Friends and Family: Once a week    Attends Religious Services: 1 to 4 times per year    Active Member of [employer] or Organizations: No     Attends Banker Meetings: Not on file    Marital Status: Married  Intimate Partner Violence: Not At Risk (02/22/2022)   [facility] prior to 07/06/2022., [facility] visits prior to 07/06/2022.   Humiliation, Afraid, Rape, and Kick questionnaire    Fear of Current or Ex-Partner: No    Emotionally Abused: No    Physically Abused: No    Sexually Abused: No     Allergies  Allergen Reactions   Codeine Nausea And Vomiting     Outpatient Medications Prior to Visit  Medication Sig Dispense Refill   acetaminophen  (TYLENOL ) 500 MG tablet Take 1,000 mg by mouth every 6 (six) hours as needed for moderate pain.     albuterol (VENTOLIN HFA) 108 (90 Base) MCG/ACT inhaler Inhale 1-2 puffs into the lungs every 6 (six) hours as needed for wheezing or shortness of breath.     atorvastatin  (LIPITOR) 40 MG tablet Take 1 tablet (40 mg total) by mouth daily. 90 tablet 3   Boswellia-Glucosamine-Vit D (OSTEO BI-FLEX ONE PER DAY) TABS Take 1 tablet by mouth daily.     Fluticasone-Umeclidin-Vilant (TRELEGY ELLIPTA ) 200-62.5-25 MCG/ACT AEPB Inhale 1 puff into the lungs daily. 60 each 3   Lifitegrast (XIIDRA) 5 % SOLN Place 1 drop into both eyes in the morning and at bedtime.     omeprazole (PRILOSEC) 40 MG capsule Take 40 mg by mouth daily.     Fluticasone-Umeclidin-Vilant (TRELEGY ELLIPTA ) 200-62.5-25 MCG/ACT AEPB Inhale 1 puff into the lungs daily. (Patient not taking: Reported on 05/21/2023)     No facility-administered medications prior to visit.         Objective:   Physical Exam Vitals:   05/21/23 1537  BP: 104/64  Pulse: 93  Temp: 97.9 F (36.6 C)  TempSrc: Oral  SpO2: 93%  Weight: 144 lb 9.6 oz (65.6 kg)  Height: 5' 9.5" (1.765 m)   Gen: Pleasant, well-nourished, in no distress,  normal affect  ENT: No lesions,  mouth clear,  oropharynx clear, no postnasal drip  Neck: No JVD, no stridor  Lungs: No use of  accessory muscles, distant but no crackles or wheezing on normal respiration, no wheeze on forced expiration  Cardiovascular: RRR, heart sounds normal, no murmur or gallops, no peripheral edema  Musculoskeletal: No deformities, no cyanosis or clubbing  Neuro: alert, awake, non focal  Skin: Warm, no lesions or rash      Assessment & Plan:  COPD (chronic obstructive pulmonary disease) (HCC) Severe obstruction documented on her PFT from 03/18/2023.  She has benefited from the Trelegy.  Some increased congestion but no bronchospasm, no evidence of an acute exacerbation.  Plan to continue her Trelegy.  Discussed smoking cessation with her.  Flu shot is up-to-date.  Will give her the Prevnar 20 today.  She will follow-up with Dr. Marygrace Snellen.  History of tobacco use Still smoking.  She is motivated to cut down.  Encouraged her to work on this.  Her lung cancer screening CT chest was July 2024.  Next 1 to be done in July 2025.  Time spent 30 minutes On Rose Buddle, MD, PhD 05/21/2023, 4:46 PM [facility] [phone] or if no answer before 7:00PM call [phone] For any issues after 7:00PM please call eLink [phone]

## 2023-06-18 ENCOUNTER — Ambulatory Visit: Payer: Commercial Managed Care - PPO | Admitting: Pulmonary Disease

## 2023-07-19 ENCOUNTER — Other Ambulatory Visit: Payer: Self-pay | Admitting: Pulmonary Disease

## 2023-07-19 DIAGNOSIS — J4540 Moderate persistent asthma, uncomplicated: Secondary | ICD-10-CM

## 2023-11-25 ENCOUNTER — Other Ambulatory Visit: Payer: Self-pay | Admitting: Pulmonary Disease

## 2023-11-25 DIAGNOSIS — J4540 Moderate persistent asthma, uncomplicated: Secondary | ICD-10-CM

## 2023-11-28 ENCOUNTER — Ambulatory Visit: Admitting: Pulmonary Disease

## 2023-11-28 ENCOUNTER — Telehealth: Payer: Self-pay | Admitting: Cardiovascular Disease

## 2023-11-28 ENCOUNTER — Encounter: Payer: Self-pay | Admitting: Pulmonary Disease

## 2023-11-28 VITALS — BP 130/68 | HR 81 | Temp 97.8°F | Ht 69.0 in | Wt 132.6 lb

## 2023-11-28 DIAGNOSIS — F17210 Nicotine dependence, cigarettes, uncomplicated: Secondary | ICD-10-CM | POA: Diagnosis not present

## 2023-11-28 DIAGNOSIS — Z716 Tobacco abuse counseling: Secondary | ICD-10-CM

## 2023-11-28 DIAGNOSIS — J4540 Moderate persistent asthma, uncomplicated: Secondary | ICD-10-CM

## 2023-11-28 DIAGNOSIS — Z122 Encounter for screening for malignant neoplasm of respiratory organs: Secondary | ICD-10-CM

## 2023-11-28 MED ORDER — VARENICLINE TARTRATE (STARTER) 0.5 MG X 11 & 1 MG X 42 PO TBPK: ORAL_TABLET | ORAL | 0 refills | Status: DC

## 2023-11-28 MED ORDER — VARENICLINE TARTRATE 1 MG PO TABS: 1.0000 mg | ORAL_TABLET | Freq: Two times a day (BID) | ORAL | 1 refills | Status: DC

## 2023-11-28 NOTE — Telephone Encounter (Signed)
 Left message for patient to call back

## 2023-11-28 NOTE — Progress Notes (Signed)
 @Patient  ID: Rose Blair, female    DOB: 1961-02-09, 63 y.o.   MRN: [id number]  Chief Complaint  Patient presents with   Medical Management of Chronic Issues    Referring provider: Joshua Santana LITTIE, NP  HPI:   63 y.o. woman whom we are seeing for evaluation of dyspnea on exertion.  Most recent pulmonary note from Dr. Shelah reviewed.  Returns for routine follow-up.  Improvement in symptoms of shortness of breath with Trelegy.  She is pleased with this.  She has had some issues with statin, muscle pain and some weakness stomach upset.  She has not followed up with her cardiologist as he retired.  And no follow-up was made in the interim.  I gave her the number to contact them to discuss her concerns.  These are reasonable.  We did note she has significant coronary arthrosclerosis as well as aortic atherosclerosis of some lipid therapy is probably important but there are ways to go about it they could help her minimize side effects.  Reviewed her prior CT scan 11/2022.  Overdue for lung cancer screening.  She agrees to move forward with repeat scan, this is ordered today.  We discussed smoking cessation, Chantix prescribed.  HPI at initial visit Patient notes dyspnea exertion for the last 6 months or so.  Typically with long walks.  Maybe a bit worse on inclines or stairs.  Short stairs like getting it out not a big deal.  She reports feeling a bit panicky at this time.  She is unclear if this is related to true dyspnea or just panic attacks.  But notes that with long walks she gets very short of breath and needs to stop to recover her breathing.  No time of day when things are better or worse.  She does think with change of seasons, spring and fall the symptoms seem a bit worse over the last few months.  No position to make things better or worse.  Albuterol helps sometimes but not always.  No other alleviating or exacerbating factors.  As part of her workup she had an EKG that the  automatic read reports possible right atrial enlargement.  She had a CT scan lung cancer screening 11/2022 that demonstrated significant emphysematous changes in the upper lobes, more mild in the right middle lobe and lower lobes, biapical scarring, without significant finding otherwise.  Reports coronary artery calcifications.  Seen by cardiology.  Per her report they would not think the dyspnea is related to her heart.  No additional testing was recommended or ordered.  She is a current smoker, currently reducing smoking less than 5 cigarettes a day.  At 1 point he quit for 6 months.  On average 1 pack a day for the last 44 years.  At height was smoking 1.5 packs/day.  Questionaires / Pulmonary Flowsheets:   ACT:      No data to display          MMRC:     No data to display          Epworth:      No data to display          Tests:   FENO:  No results found for: NITRICOXIDE  PFT:    Latest Ref Rng & Units 03/18/2023    9:43 AM  PFT Results  FVC-Pre L 2.78   FVC-Predicted Pre % 70   FVC-Post L 3.09   FVC-Predicted Post % 78   Pre FEV1/FVC % %  55   Post FEV1/FCV % % 54   FEV1-Pre L 1.54   FEV1-Predicted Pre % 50   FEV1-Post L 1.68   DLCO uncorrected ml/min/mmHg 12.77   DLCO UNC% % 53   DLCO corrected ml/min/mmHg 12.48   DLCO COR %Predicted % 51   DLVA Predicted % 68   TLC L 6.01   TLC % Predicted % 102   RV % Predicted % 132   Personally reviewed interpreted as normal spirometry, no bronchodilator response, lung work consistent with air trapping, DLCO moderately reduced  WALK:      No data to display          Imaging: Personally reviewed and as per EMR and discussion in this note No results found.  Lab Results: Personally reviewed CBC    Component Value Date/Time   WBC 8.4 09/14/2021 2029   RBC 3.56 (L) 09/14/2021 2029   HGB 12.8 09/14/2021 2029   HCT 35.6 (L) 09/14/2021 2029   PLT 285 09/14/2021 2029   MCV 100.0 09/14/2021 2029   MCH  36.0 (H) 09/14/2021 2029   MCHC 36.0 09/14/2021 2029   RDW 13.4 09/14/2021 2029    BMET    Component Value Date/Time   NA 135 09/14/2021 2029   K 3.8 09/14/2021 2029   CL 104 09/14/2021 2029   CO2 22 09/14/2021 2029   GLUCOSE 102 (H) 09/14/2021 2029   BUN 12 09/14/2021 2029   CREATININE 0.53 09/14/2021 2029   CALCIUM  8.9 09/14/2021 2029   GFRNONAA >60 09/14/2021 2029   GFRAA >60 02/01/2016 1340    BNP No results found for: BNP  ProBNP No results found for: PROBNP  Specialty Problems       Pulmonary Problems   COPD (chronic obstructive pulmonary disease) (HCC)    Allergies  Allergen Reactions   Codeine Nausea And Vomiting    Immunization History  Administered Date(s) Administered   Moderna Covid-19 Fall Seasonal Vaccine 7yrs & older 02/28/2022, 03/03/2023   PFIZER(Purple Top)SARS-COV-2 Vaccination 07/14/2019, 08/04/2019, 03/18/2020   PNEUMOCOCCAL CONJUGATE-20 05/21/2023   Pfizer Covid-19 Vaccine Bivalent Booster 81yrs & up 03/17/2021    Past Medical History:  Diagnosis Date   Arthritis    GERD (gastroesophageal reflux disease)    History of bronchitis    Pneumonia     Tobacco History: Social History   Tobacco Use  Smoking Status Every Day   Current packs/day: 1.00   Average packs/day: 1 pack/day for 48.0 years (48.0 ttl pk-yrs)   Types: Cigarettes  Smokeless Tobacco Never  Tobacco Comments   Smokes about 8 cigarettes a day.  Updated 05/21/2023 hfb   Ready to quit: Not Answered Counseling given: Not Answered Tobacco comments: Smokes about 8 cigarettes a day.  Updated 05/21/2023 hfb   Continue to not smoke  Outpatient Encounter Medications as of 11/28/2023  Medication Sig   acetaminophen  (TYLENOL ) 500 MG tablet Take 1,000 mg by mouth every 6 (six) hours as needed for moderate pain.   albuterol (VENTOLIN HFA) 108 (90 Base) MCG/ACT inhaler Inhale 1-2 puffs into the lungs every 6 (six) hours as needed for wheezing or shortness of breath.    atorvastatin  (LIPITOR) 40 MG tablet Take 1 tablet (40 mg total) by mouth daily.   Boswellia-Glucosamine-Vit D (OSTEO BI-FLEX ONE PER DAY) TABS Take 1 tablet by mouth daily.   Lifitegrast (XIIDRA) 5 % SOLN Place 1 drop into both eyes in the morning and at bedtime.   omeprazole (PRILOSEC) 40 MG capsule Take 40 mg by  mouth daily.   TRELEGY ELLIPTA  200-62.5-25 MCG/ACT AEPB INHALE 1 PUFF BY MOUTH EVERY DAY   varenicline (CHANTIX CONTINUING MONTH PAK) 1 MG tablet Take 1 tablet (1 mg total) by mouth 2 (two) times daily.   Varenicline Tartrate, Starter, (CHANTIX STARTING MONTH PAK) 0.5 MG X 11 & 1 MG X 42 TBPK Take one 0.5 mg tablet once a day for 3 days, then take one 0.5 mg tablet twice a day for 3 days, then take one 1 mg tablet twice a day thereafter   No facility-administered encounter medications on file as of 11/28/2023.     Review of Systems  Review of Systems  N/a Physical Exam  BP 130/68   Pulse 81   Temp 97.8 F (36.6 C) (Temporal)   Ht 5' 9 (1.753 m)   Wt 132 lb 9.6 oz (60.1 kg)   SpO2 99% Comment: RA  BMI 19.58 kg/m   Wt Readings from Last 5 Encounters:  11/28/23 132 lb 9.6 oz (60.1 kg)  05/21/23 144 lb 9.6 oz (65.6 kg)  03/18/23 145 lb (65.8 kg)  12/27/22 146 lb 3.2 oz (66.3 kg)  09/14/21 166 lb (75.3 kg)    BMI Readings from Last 5 Encounters:  11/28/23 19.58 kg/m  05/21/23 21.05 kg/m  03/18/23 21.11 kg/m  12/27/22 20.98 kg/m  09/14/21 23.82 kg/m     Physical Exam General: Sitting in chair, in no acute distress Eyes: EOMI, no icterus Neck: Supple, no JVP Pulmonary: Distant, clear, normal work of breathing Cardiovascular: Warm, no edema Abdomen: Nondistended, bowel sounds present MSK: No synovitis, no joint effusion Neuro: Normal gait, no weakness Psych: Normal mood, full affect  Assessment & Plan:   Emphysema: Related to history of cigarette smoking, 48-pack-year.  Likely contributing to shortness of breath.  Dyspnea on exertion: Suspect  multifactorial related to emphysema, deconditioning, underlying asthma.  No significant cardiac testing in the past, EKG hints at right atrial enlargement.  No additional workup ordered via cardiology.  Reveal air trapping that certainly can cause dyspnea, likely related to emphysema.  Somewhat improved with Trelegy.  Asthma: Suspect contributing to dyspnea.  Poorly controlled at initial visit.  Other inhalers have not improved her symptoms.  Improved symptoms with Trelegy.  Encouraged to continue continue.  Tobacco abuse: Encouraged yearly screening CT scans, most recent 11/2022, repeat scan ordered today, next available as we are at 1 year interval. Smoking assessment and cessation counseling I have advised the patient to quit/stop smoking as soon as possible due to high risk for multiple medical problems.  It will also be very difficult for us  to manage patient's  respiratory symptoms and status if we continue to expose her lungs to a known irritant.  Patient is willing to quit smoking. I have advised the patient that we can assist and have options of nicotine replacement therapy, provided smoking cessation education today, provided smoking cessation counseling, and provided cessation resources. Follow-up next office visit office visit for assessment of smoking cessation.  Chantix prescribed today.  I spent 4 minutes in smoking cessation counseling.   Return in about 3 months (around 02/28/2024) for f/u Dr. Annella.   Donnice JONELLE Annella, MD 11/28/2023

## 2023-11-28 NOTE — Telephone Encounter (Signed)
 Pt c/o medication issue:  1. Name of Medication:  atorvastatin  (LIPITOR) 40 MG tablet  2. How are you currently taking this medication (dosage and times per day)?  As prescribed  3. Are you having a reaction (difficulty breathing--STAT)?   4. What is your medication issue?   Patient has been having muscle aches/weakness, at times her legs feel like they're giving out. Just not feeling well in general. Please advise.

## 2023-11-28 NOTE — Patient Instructions (Addendum)
 Neck to see you again  Continue Trelegy and glad this has helped  Take Chantix as prescribed, do not start cutting back on smoking until you have gotten through the first week of the starter pack then try to cut back  Call 1 800 quit NOW and ask for nicotine replacement via lozenges or gum to help aid in the cravings as you cut back  I ordered a repeat lung cancer screening exam, CT scan.  Someone should be calling to schedule you for this next available.  I recommend calling [facility] and getting a follow-up visit regarding your concerns with the statin medication, their number is  [phone].  Return to clinic in 3 months or sooner as needed with Dr. Annella

## 2023-11-28 NOTE — Telephone Encounter (Signed)
 Spoke with pt regarding increasing aches, weakness and discomfort in the legs. Pt believes her atorvastatin  is causing her symptoms since start in August 2024 and progressively getting worst. Advised pt to take a statin holiday for a couple of weeks. Advised pt is aches and discomfort go away after a few weeks of being off the medication to stay off until she is seen by Dr. Floretta in September to further discuss cholesterol therapy. If symptoms do not improve, pt will re-start atorvastatin . Pt verbalizes understanding.

## 2023-12-04 ENCOUNTER — Ambulatory Visit
Admission: RE | Admit: 2023-12-04 | Discharge: 2023-12-04 | Disposition: A | Discharge status: Home / Self Care | Source: Ambulatory Visit | Attending: Pulmonary Disease | Admitting: Pulmonary Disease

## 2023-12-04 DIAGNOSIS — Z122 Encounter for screening for malignant neoplasm of respiratory organs: Secondary | ICD-10-CM

## 2023-12-23 ENCOUNTER — Ambulatory Visit: Payer: Self-pay | Admitting: Pulmonary Disease

## 2023-12-24 NOTE — Progress Notes (Signed)
 I called patient and got her rescheduled with [facility] on 10/30.

## 2024-01-12 ENCOUNTER — Ambulatory Visit: Admitting: Student in an Organized Health Care Education/Training Program

## 2024-01-12 NOTE — Assessment & Plan Note (Signed)
 Unfortunately she developed muscle symptoms while on atorvastatin .  She has coronary calcifications and aortic calcifications so she warrants secondary prevention with a statin.  She is agreeable to try a low-dose of Crestor  and see if she tolerates this.  We discussed that if she is intolerant of Crestor  as well then we can consider nonstatin alternatives to cholesterol lowering. -Start rosuvastatin  5 mg daily -Repeat lipid panel in 3 months

## 2024-01-12 NOTE — Progress Notes (Signed)
  Cardiology Office Note:   Date:  01/12/2024  ID:  Rose Blair, DOB [birth year], MRN [id number] PCP: Joshua Santana LITTIE, NP  [facility] Providers Cardiologist:  Aleene Passe, MD (Inactive) { Chief Complaint: No chief complaint on file.     History of Present Illness:   Rose Blair is a 63 y.o. female with a PMH of coronary artery calcifications, COPD, and tobacco use who presents for follow-up of her coronary calcifications.  Last seen a year ago for follow-up by Dr. Passe.  She was started on atorvastatin  40 mg daily at that time.  Repeat LDL was at goal. ***   Past Medical History:  Diagnosis Date   Arthritis    GERD (gastroesophageal reflux disease)    History of bronchitis    Pneumonia       Studies Reviewed:    EKG: ***           Risk Assessment/Calculations:   {Does this patient have ATRIAL FIBRILLATION?:[phone]} No BP recorded.  {Refresh Note OR Click here to enter BP  :1}***        Physical Exam:     VS:  There were no vitals taken for this visit.    Wt Readings from Last 3 Encounters:  11/28/23 132 lb 9.6 oz (60.1 kg)  05/21/23 144 lb 9.6 oz (65.6 kg)  03/18/23 145 lb (65.8 kg)     GEN: Well nourished, well developed in no acute distress NECK: No JVD; No carotid bruits CARDIAC: ***RRR, no murmurs, rubs, gallops RESPIRATORY:  Clear to auscultation without rales, wheezing or rhonchi  ABDOMEN: Soft, non-tender, non-distended EXTREMITIES:  No edema; No deformity    Assessment & Plan Coronary artery calcification Patient presenting for follow-up with coronary artery calcifications and aortic atherosclerosis.  LDL 1 year ago was very well-controlled at 40.  She is currently tolerating her medications well and has no complaints***.  No further changes at this time, but will check a lipid panel to ensure stability.  She can follow-up on a as needed basis.*** -Continue atorvastatin  40 mg daily - Lipid panel - Follow-up.      {Are you  ordering a CV Procedure (e.g. stress test, cath, DCCV, TEE, etc)?   Press F2        :[id number]}   Signed, Georganna Archer, MD 01/12/2024 5:19 PM    [facility]

## 2024-01-13 ENCOUNTER — Other Ambulatory Visit (HOSPITAL_COMMUNITY): Payer: Self-pay

## 2024-01-13 ENCOUNTER — Ambulatory Visit
Attending: Student in an Organized Health Care Education/Training Program | Admitting: Student in an Organized Health Care Education/Training Program

## 2024-01-13 ENCOUNTER — Other Ambulatory Visit: Payer: Self-pay

## 2024-01-13 VITALS — BP 120/68 | HR 87 | Ht 69.5 in | Wt 136.2 lb

## 2024-01-13 DIAGNOSIS — I517 Cardiomegaly: Secondary | ICD-10-CM | POA: Diagnosis not present

## 2024-01-13 DIAGNOSIS — I700 Atherosclerosis of aorta: Secondary | ICD-10-CM

## 2024-01-13 DIAGNOSIS — R072 Precordial pain: Secondary | ICD-10-CM | POA: Diagnosis not present

## 2024-01-13 DIAGNOSIS — Z87891 Personal history of nicotine dependence: Secondary | ICD-10-CM | POA: Diagnosis not present

## 2024-01-13 DIAGNOSIS — I2510 Atherosclerotic heart disease of native coronary artery without angina pectoris: Secondary | ICD-10-CM | POA: Diagnosis not present

## 2024-01-13 LAB — BASIC METABOLIC PANEL WITH GFR
BUN/Creatinine Ratio: 22 (ref 12–28)
BUN: 13 mg/dL (ref 8–27)
CO2: 24 mmol/L (ref 20–29)
Calcium: 9.6 mg/dL (ref 8.7–10.3)
Chloride: 99 mmol/L (ref 96–106)
Creatinine, Ser: 0.59 mg/dL (ref 0.57–1.00)
Glucose: 74 mg/dL (ref 70–99)
Potassium: 4.4 mmol/L (ref 3.5–5.2)
Sodium: 139 mmol/L (ref 134–144)
eGFR: 101 mL/min/1.73 (ref >59)

## 2024-01-13 MED ORDER — ROSUVASTATIN CALCIUM 5 MG PO TABS
5.0000 mg | ORAL_TABLET | Freq: Every day | ORAL | 3 refills | Status: DC
  Filled 2024-01-13: qty 90, 90d supply, fill #0

## 2024-01-13 MED ORDER — METOPROLOL TARTRATE 100 MG PO TABS
100.0000 mg | ORAL_TABLET | Freq: Once | ORAL | 0 refills | Status: AC
  Filled 2024-01-13: qty 1, 1d supply, fill #0

## 2024-01-13 NOTE — Patient Instructions (Addendum)
 Medication Instructions:  START Crestor  5 mg daily   *If you need a refill on your cardiac medications before your next appointment, please call your pharmacy*  Lab Work: BMP TODAY  LIPID PANEL IN 3 MONTHS   If you have labs (blood work) drawn today and your tests are completely normal, you will receive your results only by: MyChart Message (if you have MyChart) OR A paper copy in the mail If you have any lab test that is abnormal or we need to change your treatment, we will call you to review the results.  Testing/Procedures: CORONARY CTA   Your physician has requested that you have cardiac CT. Cardiac computed tomography (CT) is a painless test that uses an x-ray machine to take clear, detailed pictures of your heart. For further information please visit [email or web address] Please follow instruction sheet as given.    ECHOCARDIOGRAM  Your physician has requested that you have an echocardiogram. Echocardiography is a painless test that uses sound waves to create images of your heart. It provides your doctor with information about the size and shape of your heart and how well your heart's chambers and valves are working. This procedure takes approximately one hour. There are no restrictions for this procedure. Please do NOT wear cologne, perfume, aftershave, or lotions (deodorant is allowed). Please arrive 15 minutes prior to your appointment time.  Please note: We ask at that you not bring children with you during ultrasound (echo/ vascular) testing. Due to room size and safety concerns, children are not allowed in the ultrasound rooms during exams. Our front office staff cannot provide observation of children in our lobby area while testing is being conducted. An adult accompanying a patient to their appointment will only be allowed in the ultrasound room at the discretion of the ultrasound technician under special circumstances. We apologize for any inconvenience.   Follow-Up: At  [facility], you and your health needs are our priority.  As part of our continuing mission to provide you with exceptional heart care, our providers are all part of one team.  This team includes your primary Cardiologist (physician) and Advanced Practice Providers or APPs (Physician Assistants and Nurse Practitioners) who all work together to provide you with the care you need, when you need it.  Your next appointment:   3 month(s)  Provider:   Georganna Archer, MD     Other Instructions    Your cardiac CT will be scheduled at one of the below locations:   [facility] [address] [place], [place] [address] [phone] (Severe contrast allergies only)  OR   [facility] [address]  [place], [place] [address] [phone]  If scheduled at [facility], please arrive at the [facility] and Children's Entrance (Entrance C2) of [facility] 30 minutes prior to test start time. You can use the FREE valet parking offered at entrance C (encouraged to control the heart rate for the test)  Proceed to the [facility] Radiology Department (first floor) to check-in and test prep.  All radiology patients and guests should use entrance C2 at [facility], accessed from [facility], even though the hospital's physical address listed is [address]  If scheduled at the Heart and Vascular Tower at [address], please enter the parking lot using the [address] entrance and use the FREE valet service at the patient drop-off area. Enter the building and check-in with registration on the  main floor.  Please follow these instructions carefully (unless otherwise directed):  An IV will be required for this test and Nitroglycerin will be given.  Hold all erectile dysfunction medications at least 3 days (72 hrs) prior to test. (Ie viagra, cialis, sildenafil, tadalafil, etc)   On the Night  Before the Test: Be sure to Drink plenty of water. Do not consume any caffeinated/decaffeinated beverages or chocolate 12 hours prior to your test. Do not take any antihistamines 12 hours prior to your test.  On the Day of the Test: Drink plenty of water until 1 hour prior to the test. Do not eat any food 1 hour prior to test. You may take your regular medications prior to the test.  Take metoprolol  (Lopressor ) 100 mg two hours prior to test. Patients who wear a continuous glucose monitor MUST remove the device prior to scanning. FEMALES- please wear underwire-free bra if available, avoid dresses & tight clothing  After the Test: Drink plenty of water. After receiving IV contrast, you may experience a mild flushed feeling. This is normal. On occasion, you may experience a mild rash up to 24 hours after the test. This is not dangerous. If this occurs, you can take Benadryl 25 mg, Zyrtec, Claritin, or Allegra and increase your fluid intake. (Patients taking Tikosyn should avoid Benadryl, and may take Zyrtec, Claritin, or Allegra) If you experience trouble breathing, this can be serious. If it is severe call 911 IMMEDIATELY. If it is mild, please call our office.  We will call to schedule your test 2-4 weeks out understanding that some insurance companies will need an authorization prior to the service being performed.   For more information and frequently asked questions, please visit our website : [email or web address]  For non-scheduling related questions, please contact the cardiac imaging nurse navigator should you have any questions/concerns: Cardiac Imaging Nurse Navigators Direct Office Dial: [phone]   For scheduling needs, including cancellations and rescheduling, please call [place], [phone].

## 2024-01-13 NOTE — Assessment & Plan Note (Signed)
 She is interested in quitting smoking but unfortunately has not had success with several interventions.  We will continue to work with her to aid her in smoking cessation as we are able.

## 2024-01-14 ENCOUNTER — Ambulatory Visit: Payer: Self-pay | Admitting: Student in an Organized Health Care Education/Training Program

## 2024-01-20 ENCOUNTER — Encounter (HOSPITAL_COMMUNITY): Payer: Self-pay

## 2024-01-22 ENCOUNTER — Ambulatory Visit (HOSPITAL_COMMUNITY)
Admission: RE | Admit: 2024-01-22 | Discharge: 2024-01-22 | Disposition: A | Discharge status: Home / Self Care | Source: Ambulatory Visit | Attending: Student in an Organized Health Care Education/Training Program | Admitting: Student in an Organized Health Care Education/Training Program

## 2024-01-22 DIAGNOSIS — I2510 Atherosclerotic heart disease of native coronary artery without angina pectoris: Secondary | ICD-10-CM | POA: Diagnosis not present

## 2024-01-22 DIAGNOSIS — R072 Precordial pain: Secondary | ICD-10-CM

## 2024-01-22 MED ORDER — IOHEXOL 350 MG/ML SOLN
100.0000 mL | Freq: Once | INTRAVENOUS | Status: AC | PRN
Start: 2024-01-22 — End: 2024-01-22
  Administered 2024-01-22: 100 mL via INTRAVENOUS

## 2024-01-22 MED ORDER — NITROGLYCERIN 0.4 MG SL SUBL
0.8000 mg | SUBLINGUAL_TABLET | Freq: Once | SUBLINGUAL | Status: AC
  Administered 2024-01-22: 0.8 mg via SUBLINGUAL

## 2024-01-27 ENCOUNTER — Telehealth (HOSPITAL_COMMUNITY): Payer: Self-pay | Admitting: Student in an Organized Health Care Education/Training Program

## 2024-01-27 NOTE — Telephone Encounter (Signed)
 Patient cancelled scheduled echocardiogram and doesn't wish to reschedule at this time. Order will be removed from the echo WQ. Thank you.

## 2024-02-19 ENCOUNTER — Other Ambulatory Visit (HOSPITAL_COMMUNITY)

## 2024-03-03 ENCOUNTER — Ambulatory Visit: Admitting: Emergency Medicine

## 2024-03-04 ENCOUNTER — Encounter: Payer: Self-pay | Admitting: Pulmonary Disease

## 2024-03-04 ENCOUNTER — Ambulatory Visit: Admitting: Pulmonary Disease

## 2024-03-04 VITALS — BP 108/60 | HR 87 | Ht 69.5 in | Wt 136.8 lb

## 2024-03-04 DIAGNOSIS — F17210 Nicotine dependence, cigarettes, uncomplicated: Secondary | ICD-10-CM

## 2024-03-04 DIAGNOSIS — R0609 Other forms of dyspnea: Secondary | ICD-10-CM

## 2024-03-04 DIAGNOSIS — J439 Emphysema, unspecified: Secondary | ICD-10-CM | POA: Diagnosis not present

## 2024-03-04 DIAGNOSIS — I2510 Atherosclerotic heart disease of native coronary artery without angina pectoris: Secondary | ICD-10-CM

## 2024-03-04 DIAGNOSIS — J4540 Moderate persistent asthma, uncomplicated: Secondary | ICD-10-CM

## 2024-03-04 DIAGNOSIS — J45909 Unspecified asthma, uncomplicated: Secondary | ICD-10-CM | POA: Diagnosis not present

## 2024-03-04 LAB — CBC WITH DIFFERENTIAL/PLATELET
Basophils Absolute: 0 K/uL (ref 0.0–0.1)
Basophils Relative: 0.4 % (ref 0.0–3.0)
Eosinophils Absolute: 0.1 K/uL (ref 0.0–0.7)
Eosinophils Relative: 1.1 % (ref 0.0–5.0)
HCT: 40.3 % (ref 36.0–46.0)
Hemoglobin: 13.6 g/dL (ref 12.0–15.0)
Lymphocytes Relative: 32.7 % (ref 12.0–46.0)
Lymphs Abs: 2.3 K/uL (ref 0.7–4.0)
MCHC: 33.7 g/dL (ref 30.0–36.0)
MCV: 103.2 fl — ABNORMAL HIGH (ref 78.0–100.0)
Monocytes Absolute: 0.4 K/uL (ref 0.1–1.0)
Monocytes Relative: 5.8 % (ref 3.0–12.0)
Neutro Abs: 4.2 K/uL (ref 1.4–7.7)
Neutrophils Relative %: 60 % (ref 43.0–77.0)
Platelets: 272 K/uL (ref 150.0–400.0)
RBC: 3.9 Mil/uL (ref 3.87–5.11)
RDW: 12.7 % (ref 11.5–15.5)
WBC: 7.1 K/uL (ref 4.0–10.5)

## 2024-03-04 MED ORDER — ALBUTEROL SULFATE HFA 108 (90 BASE) MCG/ACT IN AERS: 2.0000 | INHALATION_SPRAY | Freq: Four times a day (QID) | RESPIRATORY_TRACT | 6 refills | Status: AC | PRN

## 2024-03-04 MED ORDER — TRELEGY ELLIPTA 200-62.5-25 MCG/ACT IN AEPB: 1.0000 | INHALATION_SPRAY | Freq: Every day | RESPIRATORY_TRACT | 12 refills | Status: AC

## 2024-03-04 NOTE — Progress Notes (Signed)
 @Patient  ID: Rose Blair, female    DOB: 10-05-1960, 63 y.o.   MRN: [id number]  Chief Complaint  Patient presents with   Medical Management of Chronic Issues    Pt states can not take chantix , heart MD requested het to stop it     Referring provider: Joshua Santana LITTIE, NP  HPI:   63 y.o. woman whom we are seeing for evaluation of dyspnea on exertion felt to be largely related to emphysema.  Overall doing well, stable.  Fall usually worst season for her.  But notably she is able to regularly use within the last few days without significant worsening symptoms.  Discussed with seasonal variation likely asthma overlap.  Fortunately no worsening symptoms.  Trelegy is helping quite a bit.  We discussed phenotyping in case symptoms were to worsen in the future despite triple inhaled therapy.  Prescribed Chantix  at last visit for smoking cessation.  Unfortunately she has significant side effects including mood disturbance, brain fog, nausea dizziness.  Not an option moving forward.  We discussed setting goals, trying to achieve decreased cigarette smoking with ultimate goal of abstinence.  CT lung cancer screening 11/2023 reviewed, lung RADS 2.  1 year follow-up planned.  HPI at initial visit Patient notes dyspnea exertion for the last 6 months or so.  Typically with long walks.  Maybe a bit worse on inclines or stairs.  Short stairs like getting it out not a big deal.  She reports feeling a bit panicky at this time.  She is unclear if this is related to true dyspnea or just panic attacks.  But notes that with long walks she gets very short of breath and needs to stop to recover her breathing.  No time of day when things are better or worse.  She does think with change of seasons, spring and fall the symptoms seem a bit worse over the last few months.  No position to make things better or worse.  Albuterol helps sometimes but not always.  No other alleviating or exacerbating factors.  As part of  her workup she had an EKG that the automatic read reports possible right atrial enlargement.  She had a CT scan lung cancer screening 11/2022 that demonstrated significant emphysematous changes in the upper lobes, more mild in the right middle lobe and lower lobes, biapical scarring, without significant finding otherwise.  Reports coronary artery calcifications.  Seen by cardiology.  Per her report they would not think the dyspnea is related to her heart.  No additional testing was recommended or ordered.  She is a current smoker, currently reducing smoking less than 5 cigarettes a day.  At 1 point he quit for 6 months.  On average 1 pack a day for the last 44 years.  At height was smoking 1.5 packs/day.  Questionaires / Pulmonary Flowsheets:   ACT:      No data to display          MMRC:     No data to display          Epworth:      No data to display          Tests:   FENO:  No results found for: NITRICOXIDE  PFT:    Latest Ref Rng & Units 03/18/2023    9:43 AM  PFT Results  FVC-Pre L 2.78   FVC-Predicted Pre % 70   FVC-Post L 3.09   FVC-Predicted Post % 78   Pre FEV1/FVC % %  55   Post FEV1/FCV % % 54   FEV1-Pre L 1.54   FEV1-Predicted Pre % 50   FEV1-Post L 1.68   DLCO uncorrected ml/min/mmHg 12.77   DLCO UNC% % 53   DLCO corrected ml/min/mmHg 12.48   DLCO COR %Predicted % 51   DLVA Predicted % 68   TLC L 6.01   TLC % Predicted % 102   RV % Predicted % 132   Personally reviewed interpreted as normal spirometry, no bronchodilator response, lung work consistent with air trapping, DLCO moderately reduced  WALK:      No data to display          Imaging: Personally reviewed and as per EMR and discussion in this note No results found.  Lab Results: Personally reviewed CBC    Component Value Date/Time   WBC 8.4 09/14/2021 2029   RBC 3.56 (L) 09/14/2021 2029   HGB 12.8 09/14/2021 2029   HCT 35.6 (L) 09/14/2021 2029   PLT 285 09/14/2021 2029    MCV 100.0 09/14/2021 2029   MCH 36.0 (H) 09/14/2021 2029   MCHC 36.0 09/14/2021 2029   RDW 13.4 09/14/2021 2029    BMET    Component Value Date/Time   NA 139 01/13/2024 0919   K 4.4 01/13/2024 0919   CL 99 01/13/2024 0919   CO2 24 01/13/2024 0919   GLUCOSE 74 01/13/2024 0919   GLUCOSE 102 (H) 09/14/2021 2029   BUN 13 01/13/2024 0919   CREATININE 0.59 01/13/2024 0919   CALCIUM  9.6 01/13/2024 0919   GFRNONAA >60 09/14/2021 2029   GFRAA >60 02/01/2016 1340    BNP No results found for: BNP  ProBNP No results found for: PROBNP  Specialty Problems       Pulmonary Problems   COPD (chronic obstructive pulmonary disease) (HCC)    Allergies  Allergen Reactions   Chantix  [Varenicline ] Other (See Comments)    Brain fog, nausea, dizziness    Codeine Nausea And Vomiting    Immunization History  Administered Date(s) Administered   Influenza-Unspecified 01/25/2024   Moderna Covid-19 Fall Seasonal Vaccine 56yrs & older 02/28/2022, 03/03/2023   PFIZER(Purple Top)SARS-COV-2 Vaccination 07/14/2019, 08/04/2019, 03/18/2020   PNEUMOCOCCAL CONJUGATE-20 05/21/2023   Pfizer Covid-19 Vaccine Bivalent Booster 32yrs & up 03/17/2021    Past Medical History:  Diagnosis Date   Arthritis    GERD (gastroesophageal reflux disease)    History of bronchitis    Pneumonia     Tobacco History: Social History   Tobacco Use  Smoking Status Every Day   Current packs/day: 1.00   Average packs/day: 1 pack/day for 48.0 years (48.0 ttl pk-yrs)   Types: Cigarettes  Smokeless Tobacco Never  Tobacco Comments   Smokes about 8 cigarettes a day.  Updated 05/21/2023 hfb   Ready to quit: Not Answered Counseling given: Not Answered Tobacco comments: Smokes about 8 cigarettes a day.  Updated 05/21/2023 hfb   Continue to not smoke  Outpatient Encounter Medications as of 03/04/2024  Medication Sig   acetaminophen  (TYLENOL ) 500 MG tablet Take 1,000 mg by mouth every 6 (six) hours as needed  for moderate pain.   Boswellia-Glucosamine-Vit D (OSTEO BI-FLEX ONE PER DAY) TABS Take 1 tablet by mouth daily.   metoprolol  tartrate (LOPRESSOR ) 100 MG tablet Take 1 tablet (100 mg total) by mouth once for 1 dose. Take 90-120 minutes prior to scan. Hold for SBP less than 110.   omeprazole (PRILOSEC) 40 MG capsule Take 40 mg by mouth daily.   rosuvastatin  (CRESTOR )  5 MG tablet Take 1 tablet (5 mg total) by mouth daily.   WELCHOL 625 MG tablet 3 tab(s) orally 2 times a day   [DISCONTINUED] albuterol (VENTOLIN HFA) 108 (90 Base) MCG/ACT inhaler Inhale 1-2 puffs into the lungs every 6 (six) hours as needed for wheezing or shortness of breath.   [DISCONTINUED] TRELEGY ELLIPTA  200-62.5-25 MCG/ACT AEPB INHALE 1 PUFF BY MOUTH EVERY DAY   albuterol (VENTOLIN HFA) 108 (90 Base) MCG/ACT inhaler Inhale 2 puffs into the lungs every 6 (six) hours as needed for wheezing or shortness of breath.   Fluticasone-Umeclidin-Vilant (TRELEGY ELLIPTA ) 200-62.5-25 MCG/ACT AEPB Inhale 1 puff into the lungs daily.   Lifitegrast (XIIDRA) 5 % SOLN Place 1 drop into both eyes in the morning and at bedtime.   No facility-administered encounter medications on file as of 03/04/2024.     Review of Systems  Review of Systems  N/a Physical Exam  BP 108/60   Pulse 87   Ht 5' 9.5 (1.765 m) Comment: per pt  Wt 136 lb 12.8 oz (62.1 kg)   SpO2 97%   BMI 19.91 kg/m   Wt Readings from Last 5 Encounters:  03/04/24 136 lb 12.8 oz (62.1 kg)  01/13/24 136 lb 3.2 oz (61.8 kg)  11/28/23 132 lb 9.6 oz (60.1 kg)  05/21/23 144 lb 9.6 oz (65.6 kg)  03/18/23 145 lb (65.8 kg)    BMI Readings from Last 5 Encounters:  03/04/24 19.91 kg/m  01/13/24 19.82 kg/m  11/28/23 19.58 kg/m  05/21/23 21.05 kg/m  03/18/23 21.11 kg/m     Physical Exam General: Sitting in chair, no acute distress  Eyes: No icterus Neck: No JVP appreciated Pulmonary: Normal work of breathing, distant, clear Cardiovascular: Warm, no edema  noted Abdomen: Nondistended Neuro: No focal deficits, normal gait  Assessment & Plan:   Emphysema: Related to cigarette smoking.  Likely driver of shortness of breath.  Dyspnea on exertion: Suspect related to emphysema and asthma overlap.  Air trapping noted on PFTs.  Improved with Trelegy.  Asthma: Suspect significant contributor to dyspnea.  Poorly controlled initial visit.  Marked improvement with Trelegy.  Refilled today.  Labs today for phenotyping in case exacerbations were to occur so we have a plan for additional medications.  Tobacco abuse: Encouraged yearly screening CT scans, most recent 11/2023 lung RADS 2.  Plan repeat CT scan 11/2024 when.   Return in about 6 months (around 09/02/2024) for f/u Dr. Annella.   Donnice JONELLE Annella, MD 03/04/2024

## 2024-03-04 NOTE — Patient Instructions (Addendum)
 Nice to see you again  No changes to medication - Trelegy refilled.  Blood work today cas we need more aggressive medicines for the breathing.  I recommend the covid vaccine in the fall/winter and the RSV vaccine which is needed every 2 years.  Return to clinic in 6 months or sooner as needed with Dr. Annella

## 2024-03-05 LAB — IGE: IgE (Immunoglobulin E), Serum: 27 kU/L (ref <=114)

## 2024-03-07 LAB — ALLERGEN PROFILE, PERENNIAL ALLERGEN IGE

## 2024-03-09 ENCOUNTER — Ambulatory Visit: Payer: Self-pay | Admitting: Pulmonary Disease

## 2024-03-23 ENCOUNTER — Other Ambulatory Visit: Payer: Self-pay | Admitting: Gastroenterology

## 2024-03-23 DIAGNOSIS — R634 Abnormal weight loss: Secondary | ICD-10-CM

## 2024-04-09 ENCOUNTER — Inpatient Hospital Stay: Admission: RE | Admit: 2024-04-09 | Discharge: 2024-04-09 | Attending: Gastroenterology | Admitting: Gastroenterology

## 2024-04-09 DIAGNOSIS — R634 Abnormal weight loss: Secondary | ICD-10-CM

## 2024-04-09 MED ORDER — IOPAMIDOL (ISOVUE-300) INJECTION 61%
100.0000 mL | Freq: Once | INTRAVENOUS | Status: AC | PRN
  Administered 2024-04-09: 100 mL via INTRAVENOUS

## 2024-04-14 NOTE — Progress Notes (Signed)
 Cardiology Office Note:   Date:  04/14/2024  ID:  Rose Blair, DOB 01/06/1961, MRN [id number] PCP: Joshua Santana LITTIE, NP  [facility] Providers Cardiologist:  Georganna Archer, MD { Chief Complaint:  Chief Complaint  Patient presents with   Follow-up      History of Present Illness:   Rose Blair is a 63 y.o. female with a PMH of coronary artery calcifications, COPD, and tobacco use who presents for follow-up of her coronary calcifications.  Interval History 04/14/2024  - Presents today for follow-up.  Has been having ongoing GI issues for which she is being evaluated by her gastroenterologist for. - She has not gotten her echocardiogram, but she says that her chest pain is largely resolved and thinks that it was anxiety related.  She is not interested in undergoing her echocardiogram anymore. - CCTA showed minimal calcium  with nonobstructive CAD  Past Medical History:  Diagnosis Date   Arthritis    GERD (gastroesophageal reflux disease)    History of bronchitis    Pneumonia       Studies Reviewed:    EKG: No new ECG       Cardiac Studies & Procedures   ______________________________________________________________________________________________          CT SCANS  CT CORONARY MORPH W/CTA COR W/SCORE 01/22/2024  Addendum 02/01/2024  3:11 PM ADDENDUM REPORT: 02/01/2024 15:09  EXAM: OVER-READ INTERPRETATION  CT CHEST  The following report is an over-read performed by radiologist Dr. Andrea Gasman of [facility] on 02/01/2024. This over-read does not include interpretation of cardiac or coronary anatomy or pathology. The coronary CTA interpretation by the cardiologist is attached.  COMPARISON:  Lung cancer screening CT 12/04/2023  FINDINGS: Vascular: Aortic atherosclerosis. The included aorta is normal in caliber.  Mediastinum/nodes: No adenopathy or mass. Unremarkable esophagus.  Lungs: Emphysema. No focal airspace disease.  The tiny pulmonary nodules on prior lung cancer screening stem are unchanged. No pleural fluid. The included airways are patent.  Upper abdomen: No acute or unexpected findings.  Musculoskeletal: There are no acute or suspicious osseous abnormalities.  IMPRESSION: Aortic Atherosclerosis (ICD10-I70.0) and Emphysema (ICD10-J43.9).   Electronically Signed By: Andrea Gasman M.D. On: 02/01/2024 15:09  Narrative CLINICAL DATA:  Chest pain  EXAM: Cardiac CTA  MEDICATIONS: Sub lingual nitro. 4mg  and lopressor  100mg   TECHNIQUE: A non-contrast, gated CT scan was obtained with axial slices of 2.5 mm through the heart for calcium  scoring. Calcium  scoring was performed using the Agatston method. A 120 kV prospective, gated, contrast cardiac CT scan was obtained. Gantry rotation speed was 230 msec and collimation was 0.63 mm. Two sublingual nitroglycerin  tablets (0.8 mg) were given. The 3D data set was reconstructed with motion correction for the best systolic or diastolic phase. Images were analyzed on a dedicated workstation using MPR, MIP, and VRT modes. The patient received 95 cc of contrast.  FINDINGS: Non-cardiac: See separate report from [facility]. No significant findings on limited lung and soft tissue windows.  Calcium  Score: Calcium  noted isolated to the LAD  LM 0  LAD 27.6  RCA 0  LCX 0  Total: 27.6  Coronary Arteries: Right dominant with no anomalies  LM: Normal  LAD: 1-24% calcified plaque ostium, 25-49% calcified plaque in distal vessel  D1: Normal small vessel  D2: Normal small vessel  D3: Normal small vessel  Circumflex: Normal  OM1: Normal  OM2: Normal  RCA: Normal  PDA: Normal  PLA: Normal  IMPRESSION: 1. Calcium  score 27.6 isolated  to the LAD, this is 72 nd percentile for age/sex  2.  CAD RADS 2 non obstructive CAD see description above  3.  Normal diameter ascending thoracic aorta 2.9 cm  Maude Emmer  Electronically Signed: By: Maude Emmer M.D. On: 01/22/2024 11:09     ______________________________________________________________________________________________      Risk Assessment/Calculations:              Physical Exam:     VS:  BP 114/60   Pulse 97   Ht 5' 9 (1.753 m)   Wt 140 lb (63.5 kg)   SpO2 97%   BMI 20.67 kg/m     Wt Readings from Last 3 Encounters:  03/04/24 136 lb 12.8 oz (62.1 kg)  01/13/24 136 lb 3.2 oz (61.8 kg)  11/28/23 132 lb 9.6 oz (60.1 kg)     GEN: Thin appearing female sitting comfortably in the office chair in NAD NECK: No JVD; No carotid bruits CARDIAC: RRR, no murmurs, rubs, gallops RESPIRATORY:  Clear to auscultation without rales, wheezing or rhonchi  ABDOMEN: Soft, non-tender, non-distended, normal BS EXTREMITIES: WWP, no edema; bruising along the phalanges of the right hand, radial, DP, PT pulses are 2+   Assessment & Plan Precordial pain - No longer having any chest pains that are concerning to her.  Her coronary CTA was unremarkable. -She has other issues going on with her GI system and would rather defer any echocardiography at this time.  I think this is reasonable. CTM Discontinue order for echocardiogram Follow-up in 12 months Coronary artery calcification - Mild coronary artery calcifications with a CAC score of 27.6. - I encouraged lifestyle changes and we will ensure adequate LDL. -She has been tolerating her Crestor  5 mg daily okay. - Will check a high-sensitivity CRP and lipoprotein a to further stratify. Check lipid panel Check LPA and high-sensitivity CRP Continue Crestor  5 mg daily for now  Mixed hyperlipidemia Lipid panel, LPA, CRP and Crestor  as above          Signed, Georganna Archer, MD 04/14/2024 9:44 AM    [facility]

## 2024-04-15 ENCOUNTER — Encounter: Payer: Self-pay | Admitting: Student in an Organized Health Care Education/Training Program

## 2024-04-15 ENCOUNTER — Other Ambulatory Visit (HOSPITAL_COMMUNITY): Payer: Self-pay

## 2024-04-15 ENCOUNTER — Ambulatory Visit: Attending: Internal Medicine | Admitting: Student in an Organized Health Care Education/Training Program

## 2024-04-15 VITALS — BP 114/60 | HR 97 | Ht 69.0 in | Wt 140.0 lb

## 2024-04-15 DIAGNOSIS — I2510 Atherosclerotic heart disease of native coronary artery without angina pectoris: Secondary | ICD-10-CM

## 2024-04-15 DIAGNOSIS — R072 Precordial pain: Secondary | ICD-10-CM

## 2024-04-15 DIAGNOSIS — E782 Mixed hyperlipidemia: Secondary | ICD-10-CM

## 2024-04-15 LAB — LIPID PANEL
Chol/HDL Ratio: 1.7 ratio (ref 0.0–4.4)
Cholesterol, Total: 157 mg/dL (ref 100–199)
HDL: 91 mg/dL (ref >39)
LDL Chol Calc (NIH): 50 mg/dL (ref 0–99)
Triglycerides: 85 mg/dL (ref 0–149)
VLDL Cholesterol Cal: 16 mg/dL (ref 5–40)

## 2024-04-15 MED ORDER — ROSUVASTATIN CALCIUM 5 MG PO TABS
5.0000 mg | ORAL_TABLET | Freq: Every day | ORAL | 3 refills | Status: AC
  Filled 2024-04-15: qty 90, 90d supply, fill #0

## 2024-04-15 NOTE — Assessment & Plan Note (Signed)
-   Mild coronary artery calcifications with a CAC score of 27.6. - I encouraged lifestyle changes and we will ensure adequate LDL. -She has been tolerating her Crestor  5 mg daily okay. - Will check a high-sensitivity CRP and lipoprotein a to further stratify. Check lipid panel Check LPA and high-sensitivity CRP Continue Crestor  5 mg daily for now

## 2024-04-15 NOTE — Patient Instructions (Signed)
 Medication Instructions:  REFILLED Crestor    *If you need a refill on your cardiac medications before your next appointment, please call your pharmacy*  Lab Work: Lipid panel  LP(a) High sensitivity CRP   If you have labs (blood work) drawn today and your tests are completely normal, you will receive your results only by: MyChart Message (if you have MyChart) OR A paper copy in the mail If you have any lab test that is abnormal or we need to change your treatment, we will call you to review the results.  Testing/Procedures: Cancel echocardiogram  Follow-Up: At [facility], you and your health needs are our priority.  As part of our continuing mission to provide you with exceptional heart care, our providers are all part of one team.  This team includes your primary Cardiologist (physician) and Advanced Practice Providers or APPs (Physician Assistants and Nurse Practitioners) who all work together to provide you with the care you need, when you need it.  Your next appointment:   12 month(s)  Provider:   Georganna Archer, MD

## 2024-04-16 ENCOUNTER — Telehealth: Payer: Self-pay

## 2024-04-16 DIAGNOSIS — J449 Chronic obstructive pulmonary disease, unspecified: Secondary | ICD-10-CM

## 2024-04-16 LAB — LIPOPROTEIN A (LPA): Lipoprotein (a): <8.4 nmol/L (ref <75.0)

## 2024-04-16 LAB — HIGH SENSITIVITY CRP: CRP, High Sensitivity: 0.42 mg/L (ref 0.00–3.00)

## 2024-04-16 NOTE — Telephone Encounter (Signed)
 Copied from CRM #[id number]. Topic: Clinical - Medical Advice >> Apr 15, 2024 12:30 PM Corean SAUNDERS wrote: Reason for CRM: Patient was advised by Dr. Annella to get an RSV vaccine however patient states her [facility] pharmacy only provides those to patients 74 years or older unless the provider can provide a prescription that states she needs the vaccine. Please call patient back to advise.   Please advise RSV vaccine information and I can print it off for the patient.

## 2024-04-16 NOTE — Telephone Encounter (Signed)
 Yes, I recommend the RSV vaccine, Arexvy is fine. Thanks!

## 2024-04-19 MED ORDER — RSVPREF3 VAC RECOMB ADJUVANTED 120 MCG/0.5ML IM SUSR: 0.5000 mL | Freq: Once | INTRAMUSCULAR | 0 refills | Status: AC

## 2024-04-19 NOTE — Telephone Encounter (Signed)
 I called and spoke with the pt and notified of response from Dr. Annella. Pt verbalized understanding. Rx was sent to preferred pharm. Nothing further needed.
